# Patient Record
Sex: Female | Born: 1935 | Race: White | Hispanic: No | Smoking: Never smoker
Health system: Southern US, Community
[De-identification: ages and names within clinical notes are randomized; demographics above are authoritative.]

## PROBLEM LIST (undated history)

## (undated) DIAGNOSIS — I10 Essential (primary) hypertension: Secondary | ICD-10-CM

## (undated) DIAGNOSIS — E0590 Thyrotoxicosis, unspecified without thyrotoxic crisis or storm: Secondary | ICD-10-CM

## (undated) DIAGNOSIS — I480 Paroxysmal atrial fibrillation: Secondary | ICD-10-CM

## (undated) DIAGNOSIS — C679 Malignant neoplasm of bladder, unspecified: Secondary | ICD-10-CM

## (undated) DIAGNOSIS — I639 Cerebral infarction, unspecified: Secondary | ICD-10-CM

## (undated) DIAGNOSIS — R001 Bradycardia, unspecified: Secondary | ICD-10-CM

## (undated) HISTORY — PX: PACEMAKER INSERTION: SHX728

## (undated) HISTORY — DX: Bradycardia, unspecified: R00.1

## (undated) HISTORY — DX: Thyrotoxicosis, unspecified without thyrotoxic crisis or storm: E05.90

## (undated) HISTORY — DX: Cerebral infarction, unspecified: I63.9

## (undated) HISTORY — DX: Malignant neoplasm of bladder, unspecified: C67.9

## (undated) HISTORY — DX: Essential (primary) hypertension: I10

## (undated) HISTORY — DX: Paroxysmal atrial fibrillation: I48.0

---

## 2003-06-20 ENCOUNTER — Inpatient Hospital Stay (HOSPITAL_COMMUNITY): Admission: EM | Admit: 2003-06-20 | Discharge: 2003-06-21 | Payer: Self-pay | Admitting: Emergency Medicine

## 2004-05-03 ENCOUNTER — Ambulatory Visit: Payer: Self-pay | Admitting: Family Medicine

## 2004-05-25 ENCOUNTER — Ambulatory Visit: Payer: Self-pay | Admitting: Internal Medicine

## 2004-05-27 ENCOUNTER — Ambulatory Visit: Payer: Self-pay | Admitting: Family Medicine

## 2004-06-23 ENCOUNTER — Ambulatory Visit: Payer: Self-pay | Admitting: Family Medicine

## 2004-08-23 ENCOUNTER — Ambulatory Visit: Payer: Self-pay | Admitting: Internal Medicine

## 2004-09-28 ENCOUNTER — Ambulatory Visit: Payer: Self-pay | Admitting: Family Medicine

## 2004-11-17 ENCOUNTER — Ambulatory Visit: Payer: Self-pay | Admitting: Family Medicine

## 2004-11-22 ENCOUNTER — Ambulatory Visit: Payer: Self-pay | Admitting: Internal Medicine

## 2005-02-21 ENCOUNTER — Ambulatory Visit: Payer: Self-pay | Admitting: Internal Medicine

## 2005-03-02 ENCOUNTER — Ambulatory Visit: Payer: Self-pay | Admitting: Family Medicine

## 2005-03-21 ENCOUNTER — Ambulatory Visit: Payer: Self-pay | Admitting: Family Medicine

## 2005-03-24 ENCOUNTER — Ambulatory Visit: Payer: Self-pay | Admitting: Internal Medicine

## 2005-06-20 ENCOUNTER — Ambulatory Visit: Payer: Self-pay | Admitting: Internal Medicine

## 2005-08-04 ENCOUNTER — Ambulatory Visit: Payer: Self-pay | Admitting: Family Medicine

## 2005-08-29 ENCOUNTER — Ambulatory Visit: Payer: Self-pay | Admitting: Family Medicine

## 2005-09-19 ENCOUNTER — Ambulatory Visit: Payer: Self-pay | Admitting: Internal Medicine

## 2005-09-19 ENCOUNTER — Ambulatory Visit: Payer: Self-pay | Admitting: Family Medicine

## 2005-12-19 ENCOUNTER — Ambulatory Visit: Payer: Self-pay | Admitting: Internal Medicine

## 2006-01-23 ENCOUNTER — Ambulatory Visit: Payer: Self-pay | Admitting: Family Medicine

## 2006-02-02 ENCOUNTER — Ambulatory Visit: Payer: Self-pay | Admitting: Family Medicine

## 2006-03-10 ENCOUNTER — Ambulatory Visit: Payer: Self-pay | Admitting: Family Medicine

## 2006-03-16 ENCOUNTER — Ambulatory Visit: Payer: Self-pay | Admitting: Family Medicine

## 2006-03-20 ENCOUNTER — Ambulatory Visit: Payer: Self-pay | Admitting: Internal Medicine

## 2006-05-22 ENCOUNTER — Ambulatory Visit: Payer: Self-pay | Admitting: Family Medicine

## 2006-06-12 ENCOUNTER — Ambulatory Visit: Payer: Self-pay

## 2006-06-22 ENCOUNTER — Ambulatory Visit: Payer: Self-pay | Admitting: Family Medicine

## 2006-06-29 ENCOUNTER — Ambulatory Visit: Payer: Self-pay | Admitting: Family Medicine

## 2006-07-07 ENCOUNTER — Ambulatory Visit: Payer: Self-pay | Admitting: Family Medicine

## 2006-07-19 ENCOUNTER — Ambulatory Visit: Payer: Self-pay | Admitting: Family Medicine

## 2006-07-21 ENCOUNTER — Ambulatory Visit: Payer: Self-pay | Admitting: Internal Medicine

## 2006-08-15 ENCOUNTER — Ambulatory Visit: Payer: Self-pay | Admitting: Family Medicine

## 2006-08-21 ENCOUNTER — Ambulatory Visit: Payer: Self-pay | Admitting: Internal Medicine

## 2006-09-15 ENCOUNTER — Ambulatory Visit: Payer: Self-pay | Admitting: Internal Medicine

## 2006-09-20 ENCOUNTER — Ambulatory Visit: Payer: Self-pay | Admitting: Family Medicine

## 2006-10-13 ENCOUNTER — Ambulatory Visit: Payer: Self-pay | Admitting: Internal Medicine

## 2006-11-10 ENCOUNTER — Ambulatory Visit: Payer: Self-pay | Admitting: Internal Medicine

## 2006-12-08 ENCOUNTER — Ambulatory Visit: Payer: Self-pay | Admitting: Internal Medicine

## 2007-01-05 ENCOUNTER — Ambulatory Visit: Payer: Self-pay | Admitting: Internal Medicine

## 2007-02-02 ENCOUNTER — Ambulatory Visit: Payer: Self-pay | Admitting: Internal Medicine

## 2007-03-02 ENCOUNTER — Ambulatory Visit: Payer: Self-pay | Admitting: Internal Medicine

## 2007-03-30 ENCOUNTER — Ambulatory Visit: Payer: Self-pay | Admitting: Internal Medicine

## 2007-04-27 ENCOUNTER — Ambulatory Visit: Payer: Self-pay | Admitting: Internal Medicine

## 2007-05-09 ENCOUNTER — Ambulatory Visit: Payer: Self-pay | Admitting: Internal Medicine

## 2007-05-09 ENCOUNTER — Inpatient Hospital Stay (HOSPITAL_COMMUNITY): Admission: EM | Admit: 2007-05-09 | Discharge: 2007-05-12 | Payer: Self-pay | Admitting: Emergency Medicine

## 2007-05-11 ENCOUNTER — Ambulatory Visit: Payer: Self-pay | Admitting: Physical Medicine & Rehabilitation

## 2007-05-11 ENCOUNTER — Encounter (INDEPENDENT_AMBULATORY_CARE_PROVIDER_SITE_OTHER): Payer: Self-pay | Admitting: Neurology

## 2007-05-13 ENCOUNTER — Ambulatory Visit: Payer: Self-pay | Admitting: Cardiology

## 2007-05-13 ENCOUNTER — Emergency Department (HOSPITAL_COMMUNITY): Admission: EM | Admit: 2007-05-13 | Discharge: 2007-05-13 | Payer: Self-pay | Admitting: Emergency Medicine

## 2007-05-18 ENCOUNTER — Ambulatory Visit: Payer: Self-pay | Admitting: Cardiology

## 2007-05-25 ENCOUNTER — Ambulatory Visit: Payer: Self-pay | Admitting: Internal Medicine

## 2007-05-28 ENCOUNTER — Ambulatory Visit: Payer: Self-pay

## 2007-08-24 ENCOUNTER — Ambulatory Visit: Payer: Self-pay | Admitting: Internal Medicine

## 2007-11-23 ENCOUNTER — Ambulatory Visit: Payer: Self-pay | Admitting: Internal Medicine

## 2007-12-04 ENCOUNTER — Ambulatory Visit: Payer: Self-pay | Admitting: Internal Medicine

## 2008-02-22 ENCOUNTER — Ambulatory Visit: Payer: Self-pay | Admitting: Internal Medicine

## 2008-06-02 ENCOUNTER — Encounter (INDEPENDENT_AMBULATORY_CARE_PROVIDER_SITE_OTHER): Payer: Self-pay | Admitting: *Deleted

## 2008-06-02 ENCOUNTER — Ambulatory Visit: Payer: Self-pay

## 2008-08-16 DIAGNOSIS — I4891 Unspecified atrial fibrillation: Secondary | ICD-10-CM | POA: Insufficient documentation

## 2008-08-16 DIAGNOSIS — I498 Other specified cardiac arrhythmias: Secondary | ICD-10-CM | POA: Insufficient documentation

## 2008-08-16 DIAGNOSIS — I10 Essential (primary) hypertension: Secondary | ICD-10-CM | POA: Insufficient documentation

## 2008-08-16 DIAGNOSIS — E039 Hypothyroidism, unspecified: Secondary | ICD-10-CM | POA: Insufficient documentation

## 2008-09-01 ENCOUNTER — Ambulatory Visit: Payer: Self-pay | Admitting: Internal Medicine

## 2008-12-24 ENCOUNTER — Ambulatory Visit: Payer: Self-pay | Admitting: Internal Medicine

## 2009-04-01 ENCOUNTER — Encounter: Payer: Self-pay | Admitting: Internal Medicine

## 2009-04-13 ENCOUNTER — Telehealth: Payer: Self-pay | Admitting: Internal Medicine

## 2009-04-13 ENCOUNTER — Encounter: Payer: Self-pay | Admitting: Internal Medicine

## 2009-04-13 ENCOUNTER — Ambulatory Visit: Payer: Self-pay | Admitting: Internal Medicine

## 2009-06-17 ENCOUNTER — Ambulatory Visit: Payer: Self-pay | Admitting: Internal Medicine

## 2009-07-20 ENCOUNTER — Ambulatory Visit: Payer: Self-pay | Admitting: Internal Medicine

## 2009-08-13 ENCOUNTER — Ambulatory Visit: Payer: Self-pay | Admitting: Cardiology

## 2009-08-13 ENCOUNTER — Inpatient Hospital Stay (HOSPITAL_COMMUNITY): Admission: EM | Admit: 2009-08-13 | Discharge: 2009-08-16 | Payer: Self-pay | Admitting: Emergency Medicine

## 2009-08-14 ENCOUNTER — Encounter: Payer: Self-pay | Admitting: Cardiology

## 2009-08-15 ENCOUNTER — Encounter: Payer: Self-pay | Admitting: Cardiology

## 2009-09-02 ENCOUNTER — Encounter: Payer: Self-pay | Admitting: Cardiology

## 2009-09-08 ENCOUNTER — Encounter: Payer: Self-pay | Admitting: Internal Medicine

## 2009-09-09 ENCOUNTER — Ambulatory Visit: Payer: Self-pay | Admitting: Internal Medicine

## 2009-09-09 ENCOUNTER — Encounter: Payer: Self-pay | Admitting: Internal Medicine

## 2009-09-09 DIAGNOSIS — I5032 Chronic diastolic (congestive) heart failure: Secondary | ICD-10-CM

## 2009-09-09 LAB — CONVERTED CEMR LAB
BUN: 8 mg/dL (ref 6–23)
GFR calc non Af Amer: 63 mL/min (ref >60)
Glucose, Bld: 80 mg/dL (ref 70–99)
Potassium: 3.4 meq/L — ABNORMAL LOW (ref 3.5–5.1)

## 2009-09-25 ENCOUNTER — Encounter: Payer: Self-pay | Admitting: Cardiology

## 2009-10-08 ENCOUNTER — Emergency Department (HOSPITAL_COMMUNITY): Admission: EM | Admit: 2009-10-08 | Discharge: 2009-10-08 | Payer: Self-pay | Admitting: Emergency Medicine

## 2009-10-19 ENCOUNTER — Ambulatory Visit: Payer: Self-pay | Admitting: Internal Medicine

## 2010-01-19 ENCOUNTER — Ambulatory Visit: Payer: Self-pay | Admitting: Internal Medicine

## 2010-03-30 ENCOUNTER — Ambulatory Visit: Payer: Self-pay | Admitting: Cardiology

## 2010-03-30 ENCOUNTER — Encounter: Payer: Self-pay | Admitting: Internal Medicine

## 2010-06-17 NOTE — Assessment & Plan Note (Signed)
Summary: eph/ appt is 10:15/ o.k. per kelly 4/5/ gd   Primary Provider:  Dr. Lysbeth Galas  CC:  eph/.  History of Present Illness: Amber Love returns today for followup.  She has a h/o atrial fibrillation and symptomatic bradycardia and is s/p PPM.  She returns today for followup.  She denies c/p, peripheral edema or syncope.  She was in the hospital several weeks ago with acute on chronic diastolic CHF secondary to sodium indiscretion and she was placed on lasix and returns for followup.  She is trying to stay away from the salty foods but she admits that this has been difficult.  Her weight is up approximately 7 lbs since discharge.  Current Medications (verified): 1)  Lexapro 5 Mg Tabs (Escitalopram Oxalate) .Marland Kitchen.. 1 Tab Pm 2)  Ativan 0.5 Mg Tabs (Lorazepam) .Marland Kitchen.. 1 Tab Pm 3)  Simvastatin 20 Mg Tabs (Simvastatin) .... Take One Tablet By Mouth Daily At Bedtime 4)  Levothroid 100 Mcg Tabs (Levothyroxine Sodium) .Marland Kitchen.. 1 Tab Daily 5)  Amlodipine Besylate 2.5 Mg Tabs (Amlodipine Besylate) .... Take One Tablet By Mouth Daily 6)  Warfarin Sodium 5 Mg Tabs (Warfarin Sodium) .... Use As Directed By Anticoagulation Clinic 7)  Metoprolol Tartrate 50 Mg Tabs (Metoprolol Tartrate) .... Take One Tablet By Mouth Twice A Day 8)  Aricept 5 Mg Tabs (Donepezil Hcl) .... Take One Tablet Once Daily  Allergies (verified): No Known Drug Allergies  Past History:  Past Medical History: Last updated: 08/16/2008 BLADDER CANCER, HX OF (ICD-188.9) CVA, HX OF (ICD-434.91) HYPOTHYROIDISM (ICD-244.9) BRADYCARDIA (ICD-427.89) PAROXYSMAL ATRIAL FIBRILLATION (ICD-427.31) HYPERTENSION (ICD-401.9)  Past Surgical History: Last updated: 08/16/2008 PACEMAKER, PERMANENT/MEDTRONIC SIGMA 303B (ICD-V45.01)    Review of Systems       The patient complains of dyspnea on exertion.  The patient denies chest pain, syncope, and peripheral edema.    Vital Signs:  Patient profile:   77 year old female Height:      62  inches Weight:      125 pounds Pulse rate:   63 / minute BP sitting:   132 / 80  (left arm) Cuff size:   regular  Vitals Entered By: Judithe Modest CMA (September 09, 2009 10:39 AM)  Physical Exam  General:  Well developed, well nourished, in no acute distress. Head:  normocephalic and atraumatic Eyes:  PERRLA/EOM intact; conjunctiva and lids normal. Mouth:  Teeth, gums and palate normal. Oral mucosa normal. Neck:  Neck supple, no JVD. No masses, thyromegaly or abnormal cervical nodes. Chest Wall:  Well healed PPM incision. Lungs:  Clear bilaterally to auscultation except for basilar rales. Heart:  RRR with normal S1 and S2.  PMI is not enlarged or laterally displaced. Abdomen:  Bowel sounds positive; abdomen soft and non-tender without masses, organomegaly, or hernias noted. No hepatosplenomegaly. Neurologic:  Alert and oriented x 3.   EKG  Procedure date:  09/09/2009  Findings:      Atrial fibrillation with a controlled ventricular response rate of: 63. Ventricle is paced.    PPM Specifications Following MD:  Lewayne Bunting, MD     PPM Vendor:  Medtronic     PPM Model Number:  303B     PPM Serial Number:  [id number] PPM DOI:  06/20/2003      Lead 1    Location: RA     DOI: 06/20/2003     Model #: 5284     Serial #: [id number]     Status: active Lead 2    Location: RV  DOI: 06/20/2003     Model #: [id number]     Serial #: [id number]     Status: active  Magnet Response Rate:  BOL 85 ERI 65  Indications:  Tachy-brady syndrome   PPM Follow Up Remote Check?  No Battery Voltage:  2.80 V     Battery Est. Longevity:  7 YEARS     Pacer Dependent:  No       PPM Device Measurements Atrium  Amplitude: 2 mV, Impedance: 583 ohms,  Right Ventricle  Amplitude: 11.2 mV, Impedance: 738 ohms, Threshold: 1.0 V at 0.4 msec  Episodes Percent Mode Switch:  100%     Coumadin:  Yes Ventricular High Rate:  143     Ventricular Pacing:  65%  Parameters Mode:  DDDR     Lower Rate Limit:  60      Upper Rate Limit:  120 Paced AV Delay:  250     Sensed AV Delay:  200 Next Cardiology Appt Due:  02/13/2010 Tech Comments:  Normal device function.  No changes made today.  ROV 6 months clinic.  VHR episodes are short runs of afib with RVR. Gypsy Balsam RN BSN  September 09, 2009 10:57 AM  MD Comments:  Agree with above.  Impression & Recommendations:  Problem # 1:  PACEMAKER, PERMANENT/MEDTRONIC SIGMA 303B (ICD-V45.01) Her device is working normally.  Will recheck in several months.  Problem # 2:  CHRONIC DIASTOLIC HEART FAILURE (ICD-428.32) Her weight is up and I have asked that she increase her lasix to two tabs for 3 days then go back to one tab a day. A low sodium diet is recommended. Her updated medication list for this problem includes:    Amlodipine Besylate 2.5 Mg Tabs (Amlodipine besylate) .Marland Kitchen... Take one tablet by mouth daily    Warfarin Sodium 5 Mg Tabs (Warfarin sodium) ..... Use as directed by anticoagulation clinic    Metoprolol Tartrate 50 Mg Tabs (Metoprolol tartrate) .Marland Kitchen... Take one tablet by mouth twice a day    Furosemide 40 Mg Tabs (Furosemide) .Marland Kitchen... Take one tablet by mouth daily.  Problem # 3:  PAROXYSMAL ATRIAL FIBRILLATION (ICD-427.31) Her ventricular rate is well controlled and she is predominantly paced.  Continue current meds. Her updated medication list for this problem includes:    Warfarin Sodium 5 Mg Tabs (Warfarin sodium) ..... Use as directed by anticoagulation clinic    Metoprolol Tartrate 50 Mg Tabs (Metoprolol tartrate) .Marland Kitchen... Take one tablet by mouth twice a day   Patient Instructions: 1)  Your physician has recommended you make the following change in your medication: Increase Furosemide to 40mg  1 tablet twice daily for 3 days then resume 1 tablet daily. 2)  Your physician wants you to follow-up in: 12 months with Dr Ladona Ridgel.  You will receive a reminder letter in the mail two months in advance. If you don't receive a letter, please call our office to  schedule the follow-up appointment.

## 2010-06-17 NOTE — Cardiovascular Report (Signed)
Summary: TTM   TTM   Imported By: Roderic Ovens 11/12/2009 10:06:18  _____________________________________________________________________  External Attachment:    Type:   Image     Comment:   External Document

## 2010-06-17 NOTE — Cardiovascular Report (Signed)
Summary: Office Visit   Office Visit   Imported By: Roderic Ovens 09/15/2009 16:47:17  _____________________________________________________________________  External Attachment:    Type:   Image     Comment:   External Document

## 2010-06-17 NOTE — Cardiovascular Report (Signed)
Summary: TTM   TTM   Imported By: Roderic Ovens 08/13/2009 13:21:01  _____________________________________________________________________  External Attachment:    Type:   Image     Comment:   External Document

## 2010-06-17 NOTE — Miscellaneous (Signed)
Summary: ECHO  Clinical Lists Changes  Observations: Added new observation of ECHOINTERP:  Study Conclusions    - Left ventricle: Septal and apical hypokinesis The cavity size was     normal. Wall thickness was increased in a pattern of mild LVH.     Systolic function was mildly reduced. The estimated ejection     fraction was in the range of 45% to 50%.   - Mitral valve: Thickened anterior leaflet with restricted motion ?     prolapse. MR jet eccentric in posterior direction Mild to moderate     regurgitation.   - Left atrium: The atrium was mildly dilated.   - Right atrium: The atrium was mildly dilated.   - Atrial septum: No defect or patent foramen ovale was identified.  --------------------------------------------------------------------   Prepared and Electronically Authenticated by    Charlton Haws, MD, [facility]   2011-04-01T13:56:[id number] (08/14/2009 11:18)      Echocardiogram  Procedure date:  08/14/2009  Findings:       Study Conclusions    - Left ventricle: Septal and apical hypokinesis The cavity size was     normal. Wall thickness was increased in a pattern of mild LVH.     Systolic function was mildly reduced. The estimated ejection     fraction was in the range of 45% to 50%.   - Mitral valve: Thickened anterior leaflet with restricted motion ?     prolapse. MR jet eccentric in posterior direction Mild to moderate     regurgitation.   - Left atrium: The atrium was mildly dilated.   - Right atrium: The atrium was mildly dilated.   - Atrial septum: No defect or patent foramen ovale was identified.  --------------------------------------------------------------------   Prepared and Electronically Authenticated by    Charlton Haws, MD, [facility]   2011-04-01T13:56:[id number]

## 2010-06-17 NOTE — Miscellaneous (Signed)
Summary: Home Health Certification/Care Plan  Home Health Certification/Care Plan   Imported By: Roderic Ovens 09/10/2009 15:31:29  _____________________________________________________________________  External Attachment:    Type:   Image     Comment:   External Document

## 2010-06-17 NOTE — Procedures (Signed)
Summary: pacer check/medtronic   Current Medications (verified): 1)  Lexapro 5 Mg Tabs (Escitalopram Oxalate) .Marland Kitchen.. 1 Tab Pm 2)  Ativan 0.5 Mg Tabs (Lorazepam) .Marland Kitchen.. 1 Tab Pm 3)  Simvastatin 20 Mg Tabs (Simvastatin) .... Take One Tablet By Mouth Daily At Bedtime 4)  Levothroid 100 Mcg Tabs (Levothyroxine Sodium) .Marland Kitchen.. 1 Tab Daily 5)  Amlodipine Besylate 2.5 Mg Tabs (Amlodipine Besylate) .... Take One Tablet By Mouth Daily 6)  Warfarin Sodium 5 Mg Tabs (Warfarin Sodium) .... Use As Directed By Anticoagulation Clinic 7)  Metoprolol Tartrate 50 Mg Tabs (Metoprolol Tartrate) .... Take 1/2  Tablet By Mouth Twice A Day 8)  Furosemide 40 Mg Tabs (Furosemide) .... Take One Tablet By Mouth Daily.  Allergies (verified): No Known Drug Allergies  PPM Specifications Following MD:  Lewayne Bunting, MD     PPM Vendor:  Medtronic     PPM Model Number:  303B     PPM Serial Number:  [id number] PPM DOI:  06/20/2003      Lead 1    Location: RA     DOI: 06/20/2003     Model #: 6644     Serial #: [id number]     Status: active Lead 2    Location: RV     DOI: 06/20/2003     Model #: 6387     Serial #: [id number]     Status: active  Magnet Response Rate:  BOL 85 ERI 65  Indications:  Tachy-brady syndrome   PPM Follow Up Remote Check?  No Battery Voltage:  2.80 V     Battery Est. Longevity:  6.5 years     Pacer Dependent:  No       PPM Device Measurements Atrium  Amplitude: 1.4 mV, Impedance: 546 ohms,  Right Ventricle  Amplitude: 11.20 mV, Impedance: 734 ohms, Threshold: 1.0 V at 0.4 msec  Episodes MS Episodes:  712     Percent Mode Switch:  64.8*     Coumadin:  Yes Ventricular High Rate:  70     Atrial Pacing:  12.7%     Ventricular Pacing:  18%  Parameters Mode:  DDDR     Lower Rate Limit:  60     Upper Rate Limit:  120 Paced AV Delay:  250     Sensed AV Delay:  200 Next Cardiology Appt Due:  09/14/2010 Tech Comments:  No parameter changes.  64% A-fib, + coumadin, ventricular rates controlled.  70VHR  episodes the longest all 1-2 seconds.  TTM's every 3 months with [employer].  ROV 6 months with Dr. Ladona Ridgel in [place]. Altha Harm, LPN  March 30, 2010 12:39 PM

## 2010-06-17 NOTE — Cardiovascular Report (Signed)
Summary: Office Visit   Office Visit   Imported By: Roderic Ovens 04/26/2010 14:17:56  _____________________________________________________________________  External Attachment:    Type:   Image     Comment:   External Document

## 2010-06-17 NOTE — Cardiovascular Report (Signed)
Summary: Office Visit   [facility]   Imported By: Roderic Ovens 07/13/2009 12:44:33  _____________________________________________________________________  External Attachment:    Type:   Image     Comment:   External Document

## 2010-06-17 NOTE — Cardiovascular Report (Signed)
Summary: TTM   TTM   Imported By: Roderic Ovens 02/01/2010 11:06:29  _____________________________________________________________________  External Attachment:    Type:   Image     Comment:   External Document

## 2010-06-17 NOTE — Procedures (Signed)
Summary: device/saf   Current Medications (verified): 1)  Lexapro 5 Mg Tabs (Escitalopram Oxalate) .Marland Kitchen.. 1 Tab Pm 2)  Ativan 0.5 Mg Tabs (Lorazepam) .Marland Kitchen.. 1 Tab Pm 3)  Simvastatin 20 Mg Tabs (Simvastatin) .... Take One Tablet By Mouth Daily At Bedtime 4)  Levothroid 100 Mcg Tabs (Levothyroxine Sodium) .Marland Kitchen.. 1 Tab Daily 5)  Amlodipine Besylate 2.5 Mg Tabs (Amlodipine Besylate) .... Take One Tablet By Mouth Daily 6)  Warfarin Sodium 5 Mg Tabs (Warfarin Sodium) .... Use As Directed By Anticoagulation Clinic 7)  Metoprolol Tartrate 50 Mg Tabs (Metoprolol Tartrate) .... Take One Tablet By Mouth Twice A Day  Allergies (verified): No Known Drug Allergies  PPM Specifications Following MD:  Lewayne Bunting, MD     PPM Vendor:  Medtronic     PPM Model Number:  303B     PPM Serial Number:  [id number] PPM DOI:  06/20/2003      Lead 1    Location: RA     DOI: 06/20/2003     Model #: 4098     Serial #: [id number]     Status: active Lead 2    Location: RV     DOI: 06/20/2003     Model #: 5621     Serial #: [id number]     Status: active  Magnet Response Rate:  BOL 85 ERI 65  Indications:  Tachy-brady syndrome   PPM Follow Up Remote Check?  No Battery Voltage:  2.8 V     Battery Est. Longevity:  7.5 years     Pacer Dependent:  No       PPM Device Measurements Atrium  Amplitude: 1.4 mV, Impedance: 493 ohms,  Right Ventricle  Amplitude: 11.20 mV, Impedance: 713 ohms, Threshold: 1.0 V at 0.4 msec  Episodes MS Episodes:  4     Percent Mode Switch:  98.9%     Coumadin:  Yes Ventricular High Rate:  4     Atrial Pacing:  29.2%     Ventricular Pacing:  40%  Parameters Mode:  DDDR     Lower Rate Limit:  60     Upper Rate Limit:  120 Paced AV Delay:  250     Sensed AV Delay:  200 Next Cardiology Appt Due:  12/14/2009 Tech Comments:  No parameter changes.  6024 AHR episodes, A-fib with 1.2% total heart rates > 120bpm.  Her histogram is somewhat blunted but given that she is 77 years old and had a fall  recently leaving her right ankle edematous, it is adequate.  It is also noted that there were no fractures with this fall.  She will continue with her TTM's through [employer] every 3 months and return in 6 to see Dr. Ladona Ridgel. Altha Harm, LPN  June 17, 2009 2:48 PM  MD Comments:  Agree with above.

## 2010-06-17 NOTE — Miscellaneous (Signed)
Summary: [facility] Face to Face Encounter Sheet  [facility] Face to Face Encounter Sheet   Imported By: Roderic Ovens 10/13/2009 11:14:18  _____________________________________________________________________  External Attachment:    Type:   Image     Comment:   External Document

## 2010-07-21 ENCOUNTER — Encounter: Payer: Self-pay | Admitting: Internal Medicine

## 2010-07-21 DIAGNOSIS — I495 Sick sinus syndrome: Secondary | ICD-10-CM

## 2010-08-02 LAB — DIFFERENTIAL
Basophils Relative: 1 % (ref 0–1)
Eosinophils Absolute: 0.1 10*3/uL (ref 0.0–0.7)
Monocytes Absolute: 0.7 10*3/uL (ref 0.1–1.0)
Monocytes Relative: 11 % (ref 3–12)

## 2010-08-02 LAB — BASIC METABOLIC PANEL
BUN: 11 mg/dL (ref 6–23)
CO2: 30 mEq/L (ref 19–32)
Chloride: 103 mEq/L (ref 96–112)
Creatinine, Ser: 0.84 mg/dL (ref 0.4–1.2)
Glucose, Bld: 103 mg/dL — ABNORMAL HIGH (ref 70–99)

## 2010-08-02 LAB — BRAIN NATRIURETIC PEPTIDE: Pro B Natriuretic peptide (BNP): 351 pg/mL — ABNORMAL HIGH (ref 0.0–100.0)

## 2010-08-02 LAB — CBC
MCHC: 33.8 g/dL (ref 30.0–36.0)
MCV: 91.1 fL (ref 78.0–100.0)
Platelets: 177 10*3/uL (ref 150–400)
RDW: 13.8 % (ref 11.5–15.5)

## 2010-08-02 LAB — CK TOTAL AND CKMB (NOT AT ARMC): Relative Index: INVALID (ref 0.0–2.5)

## 2010-08-04 LAB — CARDIAC PANEL(CRET KIN+CKTOT+MB+TROPI)
CK, MB: 1.5 ng/mL (ref 0.3–4.0)
CK, MB: 4.5 ng/mL — ABNORMAL HIGH (ref 0.3–4.0)
Relative Index: 4.1 — ABNORMAL HIGH (ref 0.0–2.5)
Relative Index: INVALID (ref 0.0–2.5)
Relative Index: INVALID (ref 0.0–2.5)
Total CK: 76 U/L (ref 7–177)
Troponin I: 0.02 ng/mL (ref 0.00–0.06)
Troponin I: 0.03 ng/mL (ref 0.00–0.06)
Troponin I: 0.03 ng/mL (ref 0.00–0.06)

## 2010-08-04 LAB — PROTIME-INR
INR: 2.1 — ABNORMAL HIGH (ref 0.00–1.49)
INR: 2.12 — ABNORMAL HIGH (ref 0.00–1.49)
Prothrombin Time: 21.2 seconds — ABNORMAL HIGH (ref 11.6–15.2)
Prothrombin Time: 23.4 seconds — ABNORMAL HIGH (ref 11.6–15.2)

## 2010-08-04 LAB — BASIC METABOLIC PANEL
CO2: 32 mEq/L (ref 19–32)
CO2: 37 mEq/L — ABNORMAL HIGH (ref 19–32)
Calcium: 8.7 mg/dL (ref 8.4–10.5)
Chloride: 96 mEq/L (ref 96–112)
Chloride: 98 mEq/L (ref 96–112)
Creatinine, Ser: 0.72 mg/dL (ref 0.4–1.2)
Creatinine, Ser: 0.95 mg/dL (ref 0.4–1.2)
GFR calc Af Amer: >60 mL/min (ref >60)
GFR calc Af Amer: >60 mL/min (ref >60)
GFR calc non Af Amer: 45 mL/min — ABNORMAL LOW (ref >60)
GFR calc non Af Amer: >60 mL/min (ref >60)
Glucose, Bld: 159 mg/dL — ABNORMAL HIGH (ref 70–99)
Potassium: 4 mEq/L (ref 3.5–5.1)
Potassium: 4.9 mEq/L (ref 3.5–5.1)
Sodium: 139 mEq/L (ref 135–145)
Sodium: 140 mEq/L (ref 135–145)

## 2010-08-08 ENCOUNTER — Other Ambulatory Visit: Payer: Self-pay | Admitting: Cardiology

## 2010-08-08 LAB — CBC
HCT: 34.5 % — ABNORMAL LOW (ref 36.0–46.0)
Hemoglobin: 11.4 g/dL — ABNORMAL LOW (ref 12.0–15.0)
MCHC: 33.1 g/dL (ref 30.0–36.0)
MCV: 93.1 fL (ref 78.0–100.0)
Platelets: 203 10*3/uL (ref 150–400)
RDW: 13.3 % (ref 11.5–15.5)

## 2010-08-08 LAB — POCT I-STAT, CHEM 8
BUN: 9 mg/dL (ref 6–23)
Creatinine, Ser: 0.7 mg/dL (ref 0.4–1.2)
Glucose, Bld: 185 mg/dL — ABNORMAL HIGH (ref 70–99)
Sodium: 141 mEq/L (ref 135–145)
TCO2: 30 mmol/L (ref 0–100)

## 2010-08-08 LAB — DIFFERENTIAL
Basophils Absolute: 0 10*3/uL (ref 0.0–0.1)
Eosinophils Absolute: 0 10*3/uL (ref 0.0–0.7)
Eosinophils Relative: 0 % (ref 0–5)
Lymphocytes Relative: 4 % — ABNORMAL LOW (ref 12–46)
Monocytes Absolute: 0.3 10*3/uL (ref 0.1–1.0)

## 2010-08-08 LAB — URINE CULTURE: Colony Count: NO GROWTH

## 2010-08-08 LAB — POCT CARDIAC MARKERS
CKMB, poc: 1.5 ng/mL (ref 1.0–8.0)
Myoglobin, poc: 69.4 ng/mL (ref 12–200)

## 2010-08-12 NOTE — Telephone Encounter (Signed)
[address] °

## 2010-09-09 ENCOUNTER — Other Ambulatory Visit: Payer: Self-pay | Admitting: Cardiology

## 2010-09-28 NOTE — H&P (Signed)
NAME:  Amber Love, Amber Love              ACCOUNT NO.:  [id number]   MEDICAL RECORD NO.:  [id number]          PATIENT TYPE:  INP   LOCATION:  1844                         FACILITY:  [facility]   PHYSICIAN:  Michael L. Reynolds, M.D.DATE OF BIRTH:  01/25/1921   DATE OF ADMISSION:  05/09/2007  DATE OF DISCHARGE:                              HISTORY & PHYSICAL   REASON FOR EVALUATION:  Code stroke with left-sided weakness.   HISTORY OF PRESENT ILLNESS:  This is the initial [facility] stroke  service admission for this 77 year old woman with a past medical history  which includes a pacemaker placement for tachybrady syndrome and a  remote history of atrial fibrillation.  The patient was at home this  evening with her family and by our reports at about 4:00 p.m. she was  normal, walking around preparing food and delivering a plate of food to  her daughter.  At about 4:10 p.m. she sat down, complaining that she  felt as if she was going to pass out. She also complained that she was  not seeing well, and she was having some double vision.  Her family  looked at her closely and noted that her left eye was turning in. She  also had some left-sided facial drooping and slurred speech.  EMS was  alerted and picked her up and brought her to [facility].  A  code stroke was called en route, but was later cancelled because the  patient's symptoms got much better.  However, after she arrived at the  emergency room she was evaluated by Dr. Patrica Duel who noted that she had  ongoing left facial droop, slurred speech, left hemiparesis, and  abnormal eye movements, and reconsulted for urgent neurologic opinion  regarding her situation.  The patient says that she has never had  anything like this before.  She denies any associated headache.  She has  had some palpitations today but nothing she regards as out of the  ordinary for her usual situation.  She denies associated shortness  breath, chest pain, nausea,  vomiting, alteration of consciousness.   PAST MEDICAL HISTORY:  She was diagnosed with tachybrady syndrome back  in 2005, at which time she had some syncope.  She had a pacemaker placed  at that time.  Just prior to that she did have some atrial fibrillation,  and has briefly been on Coumadin in the past but discontinued that a  couple of years ago due to epistaxis and has not been on it since.  It  is not clear that this has recurred.  Other medical problems include  hypothyroidism and hypertension.  She also has remote history of bladder  cancer.   MEDICATIONS:  1. Aspirin 81 mg daily.  2. Plavix 75 mg daily.  3. Levothyroxine 100 micrograms daily.  4. Metoprolol 50 mg b.i.d.  5. Ativan 0.5 mg q.h.s. p.r.n.   ALLERGIES:  NO KNOWN DRUG ALLERGIES.   FAMILY HISTORY:  History remarkable for stroke in mother.  Father died  of kidney failure.  She has a daughter with COPD and diabetes.   SOCIAL HISTORY:  She is a retired Education officer, environmental is normally independent in  her activities of daily living.  She actually cares for her daughter,  who is severely afflicted with diabetes and COPD.  There is no history  of tobacco, alcohol or illicit drug use.   REVIEW OF SYSTEMS:  NEUROLOGIC:  As above.  CV:  Occasional  palpitations.  No chest pain.  MUSCULOSKELETAL:  Painful feet.   Pulmonary, GI, GU, endocrine, heme, allergy, psychiatric all negative.   PHYSICAL EXAMINATION:  VITAL SIGNS:  Temperature 97.9, blood pressure of  164/70, pulse 68, respirations 18, O2 saturation 100% on two liters of  O2 nasal cannula.  Weight 130 pounds (this is estimated).  GENERAL:  This is a healthy-appearing woman supine on the hospital bed  in no distress.  HEENT:  Head normocephalic and atraumatic.  Oropharynx benign.  NECK:  Supple without carotid or supraclavicular bruits.  HEART:  Regular rate and rhythm without murmur.  CHEST:  Clear to auscultation bilaterally.  ABDOMEN:  Soft, normoactive bowel  sounds.    EXTREMITIES:  1+ edema.  Decreased pedal pulses.  NEUROLOGIC:  Mental status; she is awake and alert.  She is not able to  tell me her age, but otherwise is oriented to place and time.  She is  able to name some objects although her visual problems limit testing.  Her speech is moderately dysarthric but appropriate in content.  Cranial  Nerves: Pupils are 3 mm, briskly reactive to 2.  She has a left  esotropia and downward deviation of the left eye.  A left gaze paralysis  is noted, and she seems to have incomplete abduction of the right eye as  well.  Left facial droop is noted.  Tongue and palate move normally and  symmetrically.  Testing of visual fields is clearly limited on the left,  likely worse in the upper quadrant than in the lower quadrant.  MOTOR:  Normal bulk and tone.  She has decreased strength in the left  upper extremity, and drifts down to that of both the upper and lower  extremities.  Strength is approximately 4- to 4/5  in a pyramidal  distribution.  SENSORY:  She reports diminished pinprick sensation of the left arm  compared to the right, otherwise intact to light touch and pin prick  throughout.  COORDINATION:  Finger-to-nose is performed with difficulty due to her  visual difficulties but there is no gross ataxia.  Reflexes are 2+  symmetric.  Gait is deferred.  Toe is down on the left and up on the  right. NIHSS score is 12.   LABORATORY DATA:  CBC; white count 6.8, hemoglobin 10.4, platelets  206,000, sodium 141, potassium 3.6, chloride 108, BUN and creatinine 14  and 1.0 respectively, glucose 110.  Coag's are normal.  CT of the head  is personally reviewed and the study is remarkable primarily for atrophy  with minimal white matter disease.   IMPRESSION:  Acute posterior circulation stroke with abnormal  extraocular movements, visual field disturbance, hemiparesis, facial  droop and dysarthria.  She is a candidate for intravenous t-PA due to   time.   PLAN:  She has received intravenous t-PA with treatment starting at  18:50.  After this treatment she will be admitted to ICU for routine  post t-PA care.  She will need a repeat CT of the head tomorrow along  with CT  angiogram of the head and neck.  She cannot have an MRI due to  her  pacer.  She will also need a routine stroke workup including carotid  transcranial Doppler's, 2-D echocardiogram, stroke labs.  Physical,  occupational and speech therapy will be consulted.  Stroke service to  follow.      Michael L. Thad Ranger, M.D.  Electronically Signed     MLR/MEDQ  D:  05/09/2007  T:  05/10/2007  Job:  [id number]   cc:   Doylene Canning. Ladona Ridgel, MD  Delaney Meigs, M.D.

## 2010-09-28 NOTE — Assessment & Plan Note (Signed)
[facility]                         ELECTROPHYSIOLOGY OFFICE NOTE   NAME:WILLIAMSKamla, Amber Love                     MRN:          [id number]  DATE:12/04/2007                            DOB:          December 15, 1920    Amber Love returns today for followup.  She is a very pleasant, almost  77 year old woman with a history of symptomatic bradycardia and  paroxysmal AFib who also has some hypertension and has been on Coumadin  therapy now for several years.  She has had problems with nosebleeds in  the past, but these have been largely rectified by careful following of  her Coumadin levels and discontinuation of aspirin and Plavix.  She  returns today for evaluation.  She denies chest pain or shortness of  breath.  She has otherwise been stable.  She notes that she had had some  problems with weight loss but has gained her weight back.   MEDICATIONS:  1. Synthroid 100 mcg daily.  2. Lopressor 50 twice daily.  3. Lorazepam daily.  4. Coumadin 5 mg as directed.  5. Simvastatin 20 a day.   PHYSICAL EXAMINATION:  GENERAL;  She is a pleasant, well-appearing  elderly woman, in no distress.  VITAL SIGNS:  Blood pressure 144/69, pulse 70 and regular, respirations  were 18, and weight was 136 pounds.  NECK:  No jugular venous distention.  LUNGS:  Clear bilaterally to auscultation.  No wheezes, rales, or  rhonchi are present.  CARDIOVASCULAR:  Regular rate and rhythm.  Normal S1 and S2.  EXTREMITIES:  Demonstrated no edema.  ABDOMEN:  Soft and nontender.   Interrogation of her pacemaker demonstrates Medtronic Sigma, the P-waves  greater than 2, the R-waves greater than 11, the impedance 558 in the A  and 722 in the V, and threshold 0.5 at 0.4 in the A.  Battery voltage  was 2.8 volts.  She was 96% A-paced and 45% V-paced.   IMPRESSION:  1. Symptomatic bradycardia.  2. Status post pacemaker insertion.  3. Hypertension.  4. Paroxysmal atrial fibrillation.   DISCUSSION:  Overall, Amber Love is stable.  Her pacemaker is working  normally.  Her AFib is well controlled.  She will continue on her  Coumadin.  I will plan to see her back in the office in 1 year or sooner  should she have additional problems.     Doylene Canning. Ladona Ridgel, MD  Electronically Signed    GWT/MedQ  DD: 12/04/2007  DT: 12/05/2007  Job #: [id number]

## 2010-09-28 NOTE — Consult Note (Signed)
NAMEMarland Love  SYBRINA, LANING NO.:  [id number]   MEDICAL RECORD NO.:  [id number]          PATIENT TYPE:  EMS   LOCATION:  MAJO                         FACILITY:  [facility]   PHYSICIAN:  Gerrit Friends. Dietrich Pates, MD, FACCDATE OF BIRTH:  August 23, 1920   DATE OF CONSULTATION:  05/13/2007  DATE OF DISCHARGE:  05/13/2007                                 CONSULTATION   PRIMARY CARE PHYSICIAN:  Dr. Lysbeth Galas.   PRIMARY CARDIOLOGIST:  Dr. Ladona Ridgel.   HISTORY OF PRESENT ILLNESS:  We appreciate the opportunity to perform  this outpatient consultation on Amber Love in the emergency department  at [facility].  Amber Love presented with palpitations.  Although, she has sick sinus syndrome and has had a pacemaker for the  past 3 years, she does not recall as a similar sensation.  She noted  some extra beats and pauses, mostly when she took her own pulses.  She  also felt the pacemaker and thought that it had changed positions under  her skin.  Her family summoned EMS who transported her to the emergency  department.   The patient's prior medical records were obtained.  These included an  admission just a few days ago for a CVA/RIND.  The Neurology Service  obtained a history that she could not take warfarin, and apparently did  not change her chronic medication Which includes aspirin 81 mg daily,  clopidogrel 75 mg daily, levothyroxine 0.1 mg daily, metoprolol 50 mg  b.i.d., Ativan 0.5 mg q.h.s. p.r.n.  She has no known drug allergies.  She presented with confusion, double vision, a left central seventh  cranial nerve defect and slurred speech.  All of her symptoms cleared  over the course of a day or two.   The patient's history of Coumadin intolerance involves an episode of  epistaxis a few weeks after she started Coumadin in 2005.  She required  nasal packing for a few days.  She was told by the emergency room doctor  that she could never take Coumadin again.  She required  implantation of  a pacemaker before being started on Coumadin when she was noted to have  pauses despite the absence of an AV nodal blocking agent.  She has been  followed in the pacemaker clinic in [place] since then, although she  lives in [place] and her primary physician is in [place].   Prior medical problems include a longstanding goiter and hypothyroidism  as well as hypertension.  She has a remote history of carcinoma of the  bladder.  There is also history of Raynaud's phenomenon.   SOCIAL HISTORY:  Retired Education officer, environmental; independent in all ADLs.  She has  cared for her daughter who has multiple medical problems.  No tobacco  nor alcohol use.   FAMILY HISTORY:  Mother died at an advanced age of unknown causes and  father died at age 109 due to renal disease.  Daughter has COPD and  diabetes.   REVIEW OF SYSTEMS:  Notable for intermittent arthritic discomfort in the  feet.  She has occasional headaches.  All other systems reviewed and  are  negative.   PHYSICAL EXAMINATION:  GENERAL:  Pleasant trim older woman in no acute  distress.  VITAL SIGNS:  The temperature is 97 and blood pressure 155/70, heart  rate 70 and regular, respirations 18.  HEENT:  EOMs full; normal lids and conjunctiva; normal oral mucosa.  NECK:  Prominent goiter extending anteriorly; normal carotid upstrokes  without bruits; no jugular venous distention.  HEMATOPOIETIC:  No adenopathy.  SKIN:  No significant lesions.  PSYCHIATRIC:  Alert and oriented; normal affect.  LUNGS:  Mild kyphosis; clear lung fields.  CARDIAC:  Normal first and second heart sounds; modest systolic ejection  murmur.  ABDOMEN:  Soft and nontender; no masses; no organomegaly; normal bowel  sounds without bruits.  EXTREMITIES:  Trace edema; 1-2+ distal pulses.   LABORATORY DATA:  Recent laboratory includes a hemoglobin of 11 and a  normal metabolic profile.   IMPRESSION:  Amber Love' symptoms appear to be insignificant.  We  will  arrange for outpatient interrogation of her pacemaker.  I am concerned  about a recent and neurologic event with known chronic atrial  arrhythmias.  I have recommended that Amber Love stop Plavix and start  Coumadin at an initial dose of 5 mg 3 days per week and 2.5 mg 4 days  per week.  She reluctantly agrees to do this.  She will call the [facility]  office to arrange enrollment in anticoagulation clinic.  We will have  her evaluated by an ENT physician if she develops recurrent epistaxis.  She will require assessment of anemia by her primary care physician.      Gerrit Friends. Dietrich Pates, MD, [facility]  Electronically Signed     RMR/MEDQ  D:  05/13/2007  T:  05/14/2007  Job:  [id number]

## 2010-09-28 NOTE — Discharge Summary (Signed)
NAMEMarland Kitchen  Love, Amber              ACCOUNT NO.:  [id number]   MEDICAL RECORD NO.:  [id number]          PATIENT TYPE:  INP   LOCATION:  3015                         FACILITY:  [facility]   PHYSICIAN:  Deanna Artis. Hickling, M.D.DATE OF BIRTH:  04-19-1921   DATE OF ADMISSION:  05/09/2007  DATE OF DISCHARGE:  05/12/2007                               DISCHARGE SUMMARY   FINAL DIAGNOSES:  1. 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  2. Chronic atrial fibrillation.  3. Status post pacemaker for sick sinus syndrome.  4. Hypertension.  5. Hypothyroidism.   PROCEDURES:  1. CT scan of the brain x2.  2. CT angiogram.  3. Intravenous t-PA infusion.   COMPLICATIONS:  None.   SUMMARY OF THE HOSPITALIZATION:  The patient was admitted as a code  stroke with complaints of double vision, slurred speech, left facial  droop and left hemiparesis.  She was evaluated by my partner, Dr.  Kelli Hope, who noted that she had problems with left esotropia,  downward deviation of the left eye, left gaze paralysis, incomplete  abduction of the right eye, left facial droop and 4/5 strength on the  left side.  Dr. Thad Ranger concluded that the patient had had an acute  posterior circulation stroke, probably involving the pons.  Unfortunately, because of her pacemaker an MRI scan could not be done.  This could not be verified.   The patient responded brilliantly to the t-PA and her NIH stroke scale  which had been 13 initially dropped to zero.   The patient has been evaluated in the hospital.  A 2-D echocardiogram  shows ejection fraction of 60-70%, slight increase left ventricular  wall, normal left ventricular function, no source of emboli.  CT  angiogram showed no evidence of occlusive disease in the vertebral or  carotid circulation.  Serum homocysteine 10.2, hemoglobin A1c 5.9, total  cholesterol 144, LDL 94, HDL 42, triglycerides 42.  The patient had  benzodiazepines in her urine drug screen  which reflects the use of  Ativan at nighttime on occasion.   Her hemoglobin is stable at 11.1, hematocrit 32.4, platelet count normal  at 171,000.  Electrolytes were normal.  Blood gases were normal.   PHYSICAL EXAMINATION:  VITAL SIGNS:  Blood pressure 146/67, resting  pulse 61, respirations 18, temperature of 98.6.  GENERAL:  She is on a heart-healthy diet.  She is out of bed and active.  Her oxygenation is 95% on room air.  The patient feels well.  Her  examination was entirely normal.  NEUROLOGICAL:  Visual fields are full.  She has symmetric facial  strength.  Full extraocular movements.  Midline tongue.  Normal  strength, sensation, normal gait and diminished reflexes.   She is ready to be discharged home.   DISCHARGE MEDICATIONS:  Include Plavix 75 mg daily, Synthroid 100 mcg  daily, metoprolol 50 mg twice daily.  I have discontinued aspirin  because the combination of aspirin and Plavix does afford additional  benefit over either aspirin alone or Plavix alone and Plavix clearly was  superior to aspirin in the [place] study.  While this is  controversial I  believe that recent Retavase control studies back it up.   In addition, the patient has very significant bruising in her  extremities which is related to the combination of aspirin and Plavix.  She has been seen by rehab who felt that she did not require  comprehensive inpatient rehabilitation.  On the basis of examination  today I do not believe that she needs home health.  We discussed the use  of Coumadin for this patient.  I believe that it presents an  unreasonable risk to her particularly given that she had problems with  epistaxis in the past.   While she is at risk for having stroke because of atrial fibrillation  antiplatelet drug seemed to be the only appropriate treatment.   The patient does not have any other risk factors that need to be  addressed.  Her blood pressure today is slightly elevated but near the   target of 120/70.  The patient consumes a heart-healthy diet.  This was  discussed with her.  She does not need additional treatment for  cholesterol.  She is not diabetic.  She does not smoke.  No other risk  factors needed to be addressed.  She will follow up with Dr. Jacki Cones at [facility] in 2 months' time.  If at  that point if she is doing well she may be discharged from the practice.  She will follow up with Dr. Joette Catching, her primary care physician,  in 2-4 weeks.  We will be available to assist in any way in the interim.  The patient should increase her activity slowly.      Deanna Artis. Sharene Skeans, M.D.  Electronically Signed     WHH/MEDQ  D:  05/12/2007  T:  05/13/2007  Job:  [id number]   cc:   Delaney Meigs, M.D.  Doylene Canning. Ladona Ridgel, MD

## 2010-10-01 NOTE — Assessment & Plan Note (Signed)
[facility]                         ELECTROPHYSIOLOGY OFFICE NOTE   NAME:Amber Love, Amber Love                     MRN:          [id number]  DATE:06/12/2006                            DOB:          1921/04/23    Amber Love was seen today in the clinic on 06/12/2006 for followup of  her Medtronic Model #303-B Sigma.  Date of implant was 06/20/2003 for  tachy-brady syndrome.  On interrogation of her device, today, her  battery voltage is 2.80, P waves measured greater than 2.8 millivolts  with an atrial capture threshold of 1 volt at 0.4 milliseconds and an  atrial lead impedance of 561.  R waves measured 15.68.  The 22.4  millivolts with a ventricular capture threshold of 1 volt at 0.4  milliseconds and a ventricular rate impedance of 718 ohms.  Underlying  rhythm today was a sinus bradycardia at about 4-5 beats per minute.  There were 330 mode switch episodes totaling 13.9% of the time and the  patient is not able to take Coumadin, but she is on Plavix and aspirin  on a regular basis. There are also 171 high-ventricular rate episodes  all very short in duration, just lasting only seconds.  No changes were  made in her parameters today.  She will continue her telephone checks  through [facility] with a return office visit in 1 year's time.      Altha Harm, LPN  Electronically Signed      Doylene Canning. Ladona Ridgel, MD  Electronically Signed   PO/MedQ  DD: 06/12/2006  DT: 06/12/2006  Job #: [id number]

## 2010-10-01 NOTE — Discharge Summary (Signed)
Amber Love, Amber Love                          ACCOUNT NO.:  [id number]   MEDICAL RECORD NO.:  [id number]                   PATIENT TYPE:  INP   LOCATION:  3703                                 FACILITY:  [facility]   PHYSICIAN:  Doylene Canning. Ladona Ridgel, M.D.               DATE OF BIRTH:  1920-05-29   DATE OF ADMISSION:  06/20/2003  DATE OF DISCHARGE:  06/21/2003                                 DISCHARGE SUMMARY   DISCHARGE DIAGNOSES:  1. Admitted with symptoms of presyncope and palpitations with progressive     effect, found to have tachybrady syndrome.  2. Status post placement of a DDD pacemaker by way of the left subclavian     vein on June 20, 2003.   SECONDARY DIAGNOSES:  1. History of atrial fibrillation with spontaneous conversion found November     2004 to sinus rhythm.  2. Hypertension.  3. Hypothyroidism.  4. History of bladder cancer with 3 separate surgeries.  5. Raynaud's disease.   PROCEDURE:  June 20, 2003, dual-chamber permanent pacemaker placed, DDD  mode, by way of left subclavian vein by Dr. Doylene Canning. Ladona Ridgel.  The patient  has had no complications after the surgery.  Her wound looks good.  There is  no drainage or erythema there.  She is not complaining of pain at that site.  She had interrogation of the pacer, both intraoperatively and on the morning  of postoperative day 1.  She does have some atrial pacing but she is  predominantly sinus rhythm.   DISCHARGE MEDICATIONS:  Amber Love will go home on the following  medications:  1. Lopressor 50 mg 1/2 tab in the morning, 1/2 tab in the evening.  2. Synthroid 50 mcg daily.  3. Fosamax 70 mg daily.  4. Enteric-coated aspirin 81 mg daily for pain.  5. She takes Tylenol 325 mg 1 to 2 tabs every 4 to 6 hours as needed.   SPECIAL DISCHARGE INSTRUCTIONS:  She has been given a device discharge sheet  which outlines device protocols including the fact that she cannot shower  for 1 week, although she was able to sponge  bathe.  She is cautioned against  wearing a brassiere which might irritate the pocket wound.  She is not to  drive for 1 week and the motion of the left upper extremity has been  carefully detailed for the next week.   BRIEF HISTORY:  Amber Love is an 77 year old female.  She has a past  medical history of hypertension.  She also has hypothyroidism and atrial  fibrillation diagnosed 6 years ago.  Her atrial fibrillation resolved  spontaneously, however, she has not been on Coumadin therapy.  She did have  these tachy-palpitations during a time when she was under great stress  caring for her husband who had cancer 6 years ago.  She has had no  tachyarrhythmias since that time, however, in the past 3  weeks, she has had  trouble with dizziness and episodes of presyncope.  She has a feeling of  palpitations and this has worsened over the past few days.  She states that  she feels as if a shade is pulled over her eyes and she becomes pale.  She  denies any outright syncope or chest pain.  She visited her primary care-  giver, Dr. Delaney Meigs, and was found to be in atrial fibrillation,  then she had a 2-second pause with an escaped beat, followed by a 4-second  pause.  She had presyncope symptoms at this time.  She will be admitted for  possible pacemaker placement for tachybrady syndrome.   HOSPITAL COURSE:  Amber Love was admitted to [facility],  June 19, 2002, with a diagnosis of tachybrady syndrome observed on visit  to Dr. Lysbeth Galas, her primary care-giver.  She has presyncope with this  dysrhythmia. On the morning of June 20, 2003, a DDD pacemaker was placed  by Dr. Doylene Canning. Ladona Ridgel.  She has tolerated the procedure well and will go  home postoperative day #1, June 21, 2003.  She was discharged with the  medications and she will have followup consisting of a pacer clinic visit,  July 09, 2003 at 9:45 in the morning and she will see Dr. Ladona Ridgel, Sep 19, 2003 at 10:45 in the morning.   LABORATORY STUDIES THIS ADMISSION:  Complete blood count on admission:  White cells 8, hemoglobin 12.1, hematocrit 35.4, platelets 268,000.  Serum  electrolytes:  Sodium 141, potassium 4.3, chloride 107, BUN 9, creatinine  08, glucose 111.  Troponin I studies are negative for ischemia or myocardial  injury.  Serum TSH is pending at the time of discharge.      Maple Mirza, P.A.                    Doylene Canning. Ladona Ridgel, M.D.    GM/MEDQ  D:  06/21/2003  T:  06/23/2003  Job:  [id number]   cc:   Doylene Canning. Ladona Ridgel, M.D.   Delaney Meigs, M.D.  [address]  [place]  [place] [address]  Fax: [phone]

## 2010-10-01 NOTE — Op Note (Signed)
Amber Love, Amber Love NO.:  [id number]   MEDICAL RECORD NO.:  [id number]                   PATIENT TYPE:  INP   LOCATION:  1828                                 FACILITY:  [facility]   PHYSICIAN:  Doylene Canning. Ladona Ridgel, M.D.               DATE OF BIRTH:  1920/06/22   DATE OF PROCEDURE:  06/20/2003  DATE OF DISCHARGE:                                 OPERATIVE REPORT   PROCEDURE PERFORMED:  Implantation of a dual-chamber pacemaker.   INDICATIONS:  Symptomatic tachy-brady syndrome.   I. INTRODUCTION:  The patient is a very pleasant 77 year old woman who has  had a remote history of atrial fibrillation in the past.  She also has a  history of thyroid disease and is on Synthroid.  She has hypertension.  She  was in her usual state of health until approximately three weeks ago when  she began experiencing episodes of intermittent dizziness associated with  near loss of consciousness.  These would last for a few seconds at a time  and come and go.  Over the last several days, they have increased in  frequency and severity.  She saw her primary physician, Dr. Lysbeth Galas, earlier  today.  An EKG demonstrated long pauses in and out of atrial fibrillation.  These pauses were up to 4 seconds in duration.  She is subsequently now  referred for permanent pacemaker insertion.   II. PROCEDURE:  After informed consent was obtained, the patient was taken  to the diagnostic EP lab in a fasting state.  After the usual preparation  and draping, fentanyl and midazolam were given for sedation.  A total of 30  mL of lidocaine was infiltrated into the left infraclavicular region.  A 5  cm incision was carried out over this region and electrocautery utilized to  dissect down to the fascial plane.  10 mL of contrast injected into the left  upper extremity venous system demonstrated a patent left subclavian vein.  It was subsequently punctured x2.  The Medtronic model #5076, 52 cm active  fixation pacing lead, serial #[id number], was advanced into the right  ventricle, and the Medtronic model #5076, 45 cm active fixation pacing lead,  serial #[id number], was advanced into the right atrium.  Mapping was  initially carried out in the right ventricle, and at the final site, the R  waves measured 12 mV, and the patient's threshold was 0.6 volts, or 0.5  msec, with a pacing impedance of 994 ohms, once the lead was actively fixed.  10-volt pacing did not stimulate the diaphragm.  At this point, attention  was then turned to the atrial lead.  It was placed in the right atrial  appendage where P waves measured between 2.5 and 3 mV.  The initial pacing  threshold was somewhat increased, and at the time of the implant, 1.9 volts  at 0.5 msec.  The pacing impedance was very  stable at 656 ohms.  10-volt  pacing in the right atrium did not stimulate the diaphragm.  At this point,  the leads were secured to the subpectoralis fascia with a figure-of-eight  silk suture.  In addition, the sew-in sleeve was secured with a silk suture.  Electrocautery was then utilized to make a subcutaneous pocket.  Kanamycin  irrigation was utilized to irrigate the pocket and electrocautery utilized  to assure hemostasis.  The Medtronic Sigma dual-chamber pacemaker, serial  [id number], was then connected to the atrial and ventricular pacing leads  and placed back in the subcutaneous pocket.  The generator was secured with  a silk suture.  Additional Kanamycin was utilized to irrigate the incision  and the incision then closed with a layer of 2-0 Vicryl, followed by a layer  of 3-0 Vicryl, followed by a layer of 4-0 Vicryl.  Benzoin was painted on  the skin.  Steri-Strips were applied and a pressure dressing placed, and the  patient returned to her room in satisfactory condition.   III. COMPLICATIONS:  There were no immediate procedural complications.   IV. RESULTS:  This demonstrates successful  implantation of a Medtronic dual-  chamber pacemaker in a patient with symptomatic tachy-brady syndrome and  long pauses of over four seconds.                                               Doylene Canning. Ladona Ridgel, M.D.    GWT/MEDQ  D:  06/20/2003  T:  06/22/2003  Job:  [id number]   cc:   Delaney Meigs, M.D.  [address]  [place]  [place] [address]  Fax: [phone]   Pricilla Riffle, M.D.   Vonna Kotyk Cochrein, Dr.

## 2010-10-01 NOTE — H&P (Signed)
NAMEKINSEY, KARCH NO.:  [id number]   MEDICAL RECORD NO.:  [id number]                   PATIENT TYPE:  INP   LOCATION:  1828                                 FACILITY:  [facility]   PHYSICIAN:  Doylene Canning. Ladona Ridgel, M.D.               DATE OF BIRTH:  October 30, 1920   DATE OF ADMISSION:  06/20/2003  DATE OF DISCHARGE:                                HISTORY & PHYSICAL   ADMISSION DIAGNOSIS:  Symptomatic tachy-brady with near syncope secondary to  long pauses.   HISTORY OF PRESENT ILLNESS:  The patient is a very pleasant 77 year old  woman  with a history of hypertension in the past and hypothyroidism who has  been on Synthroid. She has a history of a goiter. She has been euthyroid by  symptoms. She has had a history remotely of atrial fibrillation. This has  been well-controlled. Over the last two to three weeks she had recurrent  episodes of dizziness with near syncope. These were particularly severe  today and her granddaughters made her go see her primary physician Dr.  Lysbeth Galas. An EKG there demonstrated paroxysms of atrial fibrillation with long  post termination pauses of over 4 seconds. She was admitted for permanent  pacemaker insertion. The patient denies any changes in her medical therapy.   PAST MEDICAL HISTORY:  Also notable for bladder cancer and  a history of  Raynaud's phenomenon. She is status post surgery for her bladder cancer.   ADMISSION MEDICATIONS:  1. Lopressor 25 mg twice a day, which she has taken for some time.  2. Synthroid 50 mcg per day.  3. Fosamax once a week.  4. Aspirin.   SOCIAL HISTORY:  The patient lives alone in __________. 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 She denies tobacco abuse and she denies alcohol  abuse.   FAMILY HISTORY:  Mother died at age 64 of unknown causes and father died at  age 38 of kidney disease.   REVIEW OF SYSTEMS:  Positive for presyncope and palpitations as well as  arthralgias. The  rest of the review of systems has been noted by Chinita Pester  and is essentially negative except as noted in the HPI.   PHYSICAL EXAMINATION:  GENERAL:  She is a pleasant, well-appearing 82-year-  old woman  in no distress.  VITAL SIGNS:  The blood pressure was 150/70, pulse 65 and regular,  respirations 18. The weight was 147 pounds.  HEENT:  The patient is wearing glasses, but was normocephalic and  atraumatic. The pupils were equal and round. The oropharynx was moist. The  sclerae were anicteric.  NECK:  No jugular venous distention. There was a very small goiter present.  There was no thyroid bruits noted. Trachea was midline.  CARDIOVASCULAR:  Regular rate and rhythm with normal S1 and S2. I did not  appreciate any gallops today.  LUNGS:  Clear bilaterally to auscultation.  ABDOMEN:  Soft, nontender, nondistended. There was no organomegaly.  EXTREMITIES:  No cyanosis, clubbing, or edema.  NEUROLOGIC:  Nonfocal.   LABORATORY DATA:  EKG demonstrates atrial fibrillation with long pauses or  up to four seconds.   IMPRESSION:  1. Recurrent episodes of near syncope.  2. Documented bradycardia with long pauses while awake of over 4 seconds     secondary to paroxysmal atrial fibrillation.  3. Hypertension.  4. History of bladder cancer.   DISCUSSION:  I discussed the treatments options with Ms. Credit. Permanent  pacemaker insertion is indicated for symptomatic tachy-brady syndrome. The  risks, benefits, goals, and expectations of the procedure have been  discussed with the patient and she wishes to proceed.                                                Doylene Canning. Ladona Ridgel, M.D.    GWT/MEDQ  D:  06/20/2003  T:  06/20/2003  Job:  [id number]   cc:   Delaney Meigs, M.D.  [address]  [place]  [place] [address]  Fax: [phone]

## 2010-10-13 ENCOUNTER — Encounter: Payer: Self-pay | Admitting: Internal Medicine

## 2010-10-14 ENCOUNTER — Encounter: Payer: Self-pay | Admitting: Internal Medicine

## 2010-10-14 ENCOUNTER — Ambulatory Visit (INDEPENDENT_AMBULATORY_CARE_PROVIDER_SITE_OTHER): Payer: Medicare Other | Admitting: Internal Medicine

## 2010-10-14 DIAGNOSIS — I509 Heart failure, unspecified: Secondary | ICD-10-CM

## 2010-10-14 DIAGNOSIS — Z950 Presence of cardiac pacemaker: Secondary | ICD-10-CM

## 2010-10-14 DIAGNOSIS — I495 Sick sinus syndrome: Secondary | ICD-10-CM

## 2010-10-14 DIAGNOSIS — I4891 Unspecified atrial fibrillation: Secondary | ICD-10-CM

## 2010-10-14 DIAGNOSIS — I5032 Chronic diastolic (congestive) heart failure: Secondary | ICD-10-CM

## 2010-10-14 NOTE — Assessment & Plan Note (Signed)
She is now mostly in atrial fibrillation. She is minimally symptomatic. Her ventricular rate appears to be fairly well controlled. We'll plan to continue her current medical therapy.

## 2010-10-14 NOTE — Progress Notes (Signed)
HPI Amber Love returns today for followup. She has a history of hypertension and symptomatic bradycardia, status post permanent pacemaker insertion. She denies chest pain or shortness of breath. She admits to being sedentary. Her daughter notes that she watches a lot of TV. She has had no syncope. No peripheral edema. She denies palpitations. No symptomatic atrial fibrillation. Not on File   Current Outpatient Prescriptions  Medication Sig Dispense Refill  . amLODipine (NORVASC) 2.5 MG tablet TAKE ONE TABLET BY MOUTH ONE TIME DAILY  30 tablet  10  . escitalopram (LEXAPRO) 5 MG tablet Take 5 mg by mouth daily.        . furosemide (LASIX) 40 MG tablet        . Levothyroxine Sodium 100 MCG CAPS Take 1 capsule by mouth daily.        Marland Kitchen LORazepam (ATIVAN) 0.5 MG tablet Take 0.5 mg by mouth every 8 (eight) hours.        . metoprolol (LOPRESSOR) 50 MG tablet Take 50 mg by mouth 2 (two) times daily. 1/2 tab bid        . propoxyphene-acetaminophen (DARVOCET-N 100) 100-650 MG per tablet Take 1 tablet by mouth every 6 (six) hours as needed.        . simvastatin (ZOCOR) 20 MG tablet Take 20 mg by mouth at bedtime.        Marland Kitchen warfarin (COUMADIN) 5 MG tablet Take 5 mg by mouth daily.        Marland Kitchen DISCONTD: furosemide (LASIX) 40 MG tablet TAKE ONE TABLET BY MOUTH ONE TIME DAILY  30 tablet  10     Past Medical History  Diagnosis Date  . Bladder cancer   . CVA (cerebral infarction)   . Hyperthyroidism   . Bradycardia   . Paroxysmal atrial fibrillation   . Hypertension     ROS:   All systems reviewed and negative except as noted in the HPI.   Past Surgical History  Procedure Date  . Pacemaker insertion     medtronic     No family history on file.   History   Social History  . Marital Status: Widowed    Spouse Name: N/A    Number of Children: N/A  . Years of Education: N/A   Occupational History  . Not on file.   Social History Main Topics  . Smoking status: Never Smoker   .  Smokeless tobacco: Never Used  . Alcohol Use: No  . Drug Use: No  . Sexually Active: Not on file   Other Topics Concern  . Not on file   Social History Narrative  . No narrative on file     BP 116/67  Pulse 65  Wt 142 lb (64.411 kg)  Physical Exam:  elderly appearing NAD HEENT: Unremarkable Neck:  No JVD, no thyromegally Lymphatics:  No adenopathy Back:  No CVA tenderness Lungs:  Clear. Well-healed pacemaker incision. HEART:  Regular rate rhythm, no murmurs, no rubs, no clicks Abd:  Flat, positive bowel sounds, no organomegally, no rebound, no guarding Ext:  2 plus pulses, no edema, no cyanosis, no clubbing Skin:  No rashes no nodules Neuro:  CN II through XII intact, motor grossly intact  DEVICE  Normal device function.  See PaceArt for details.   Assess/Plan:

## 2010-10-14 NOTE — Assessment & Plan Note (Signed)
Her device is working normally. She has approximately 7 years of battery longevity. Will recheck in several months.

## 2010-10-14 NOTE — Assessment & Plan Note (Signed)
She is currently sedentary but clinical class II. She will continue her current medications. I have asked her to maintain a low-sodium diet. I've also asked her to start walking a regular basis.

## 2010-10-20 ENCOUNTER — Encounter: Payer: Self-pay | Admitting: Internal Medicine

## 2010-10-20 DIAGNOSIS — I495 Sick sinus syndrome: Secondary | ICD-10-CM

## 2011-01-19 ENCOUNTER — Encounter: Payer: Self-pay | Admitting: Internal Medicine

## 2011-01-19 DIAGNOSIS — I495 Sick sinus syndrome: Secondary | ICD-10-CM

## 2011-02-18 LAB — LIPID PANEL
Cholesterol: 144
HDL: 42
LDL Cholesterol: 94
Total CHOL/HDL Ratio: 3.4

## 2011-02-18 LAB — BASIC METABOLIC PANEL
CO2: 23
Calcium: 8.2 — ABNORMAL LOW
Chloride: 109
GFR calc Af Amer: >60
GFR calc Af Amer: >60
GFR calc non Af Amer: >60
Glucose, Bld: 141 — ABNORMAL HIGH
Potassium: 3.6
Sodium: 140
Sodium: 140

## 2011-02-18 LAB — DIFFERENTIAL
Basophils Absolute: 0
Eosinophils Absolute: 0.2
Eosinophils Relative: 3
Lymphocytes Relative: 12
Lymphocytes Relative: 15
Lymphocytes Relative: 19
Lymphs Abs: 0.9
Lymphs Abs: 1.3
Monocytes Absolute: 0.7
Monocytes Absolute: 0.8
Monocytes Relative: 10
Neutro Abs: 4.7
Neutro Abs: 7.5
Neutrophils Relative %: 69
Neutrophils Relative %: 80 — ABNORMAL HIGH

## 2011-02-18 LAB — APTT: aPTT: 32

## 2011-02-18 LAB — CK TOTAL AND CKMB (NOT AT ARMC)
Relative Index: INVALID
Total CK: 20
Total CK: 28
Total CK: 49

## 2011-02-18 LAB — TROPONIN I
Troponin I: 0.02
Troponin I: 0.02

## 2011-02-18 LAB — PROTIME-INR
INR: 1
Prothrombin Time: 13.7

## 2011-02-18 LAB — CBC
HCT: 31 — ABNORMAL LOW
HCT: 33.4 — ABNORMAL LOW
Hemoglobin: 11.1 — ABNORMAL LOW
Hemoglobin: 11.3 — ABNORMAL LOW
MCHC: 34.2
MCV: 92.3
Platelets: 206
RBC: 3.5 — ABNORMAL LOW
RBC: 3.59 — ABNORMAL LOW
RDW: 13.1
WBC: 6.4

## 2011-02-18 LAB — POCT CARDIAC MARKERS
Myoglobin, poc: 77.5
Operator id: 294521

## 2011-02-18 LAB — POCT I-STAT CREATININE: Creatinine, Ser: 1

## 2011-02-18 LAB — COMPREHENSIVE METABOLIC PANEL
CO2: 27
Calcium: 8.3 — ABNORMAL LOW
Creatinine, Ser: 0.8
GFR calc Af Amer: >60
GFR calc non Af Amer: >60
Glucose, Bld: 114 — ABNORMAL HIGH

## 2011-02-18 LAB — I-STAT 8, (EC8 V) (CONVERTED LAB)
Acid-Base Excess: 2
BUN: 14
Chloride: 108
Potassium: 3.6
pH, Ven: 7.346 — ABNORMAL HIGH

## 2011-02-18 LAB — URINALYSIS, ROUTINE W REFLEX MICROSCOPIC
Ketones, ur: 15 — AB
Nitrite: NEGATIVE
Protein, ur: NEGATIVE
Urobilinogen, UA: 1

## 2011-02-18 LAB — HEMOGLOBIN A1C: Hgb A1c MFr Bld: 5.9

## 2011-04-20 ENCOUNTER — Encounter: Payer: Self-pay | Admitting: Internal Medicine

## 2011-04-20 DIAGNOSIS — I495 Sick sinus syndrome: Secondary | ICD-10-CM

## 2011-07-20 ENCOUNTER — Encounter: Payer: Self-pay | Admitting: Internal Medicine

## 2011-07-20 DIAGNOSIS — I495 Sick sinus syndrome: Secondary | ICD-10-CM

## 2011-09-01 ENCOUNTER — Other Ambulatory Visit: Payer: Self-pay | Admitting: *Deleted

## 2011-09-01 MED ORDER — AMLODIPINE BESYLATE 2.5 MG PO TABS: 2.5000 mg | ORAL_TABLET | Freq: Every day | ORAL | Status: DC

## 2011-09-01 NOTE — Telephone Encounter (Signed)
Refilled amlodipine 2.5 mg 

## 2011-09-07 ENCOUNTER — Telehealth: Payer: Self-pay | Admitting: Internal Medicine

## 2011-09-07 NOTE — Telephone Encounter (Signed)
Received a call from Marin Olp NP with [facility] in [place], [phone].  She needs to speak to Dr. Ladona Ridgel about an alternative to the patient coumadin, as patient is not regulating on coumadin.  Patient has A-Fib.

## 2011-10-19 DIAGNOSIS — I495 Sick sinus syndrome: Secondary | ICD-10-CM

## 2011-11-07 ENCOUNTER — Encounter: Payer: Self-pay | Admitting: Internal Medicine

## 2011-11-07 ENCOUNTER — Ambulatory Visit (INDEPENDENT_AMBULATORY_CARE_PROVIDER_SITE_OTHER): Payer: Medicare Other | Admitting: Internal Medicine

## 2011-11-07 VITALS — BP 132/66 | HR 64 | Resp 18 | Ht 62.0 in | Wt 143.0 lb

## 2011-11-07 DIAGNOSIS — I4891 Unspecified atrial fibrillation: Secondary | ICD-10-CM

## 2011-11-07 DIAGNOSIS — Z950 Presence of cardiac pacemaker: Secondary | ICD-10-CM

## 2011-11-07 DIAGNOSIS — W19XXXA Unspecified fall, initial encounter: Secondary | ICD-10-CM

## 2011-11-07 LAB — PACEMAKER DEVICE OBSERVATION
AL AMPLITUDE: 1 mv
AL IMPEDENCE PM: 494 Ohm
BAMS-0001: 160 {beats}/min
RV LEAD AMPLITUDE: 11.2 mv
RV LEAD IMPEDENCE PM: 713 Ohm
RV LEAD THRESHOLD: 1 V

## 2011-11-07 NOTE — Progress Notes (Signed)
HPI Amber Love returns today for followup. She is a pleasant 78 yo woman with a h/o chronic atrial fib, symptomatic bradycardia, s/p PPM, and HTN. She has a h/o stroke and is on coumadin. The patient's daughter who is with her today notes that she has had multiple falls, though no broken bones or hard falls. She denies chest pain or sob. No syncope. Not on File   Current Outpatient Prescriptions  Medication Sig Dispense Refill  . amLODipine (NORVASC) 2.5 MG tablet Take 1 tablet (2.5 mg total) by mouth daily.  30 tablet  3  . Atorvastatin Calcium (LIPITOR PO) Take by mouth daily.      Marland Kitchen escitalopram (LEXAPRO) 5 MG tablet Take 5 mg by mouth daily.        . furosemide (LASIX) 40 MG tablet Take 40 mg by mouth as directed.       . Levothyroxine Sodium 100 MCG CAPS Take 1 capsule by mouth daily.        Marland Kitchen LORazepam (ATIVAN) 0.5 MG tablet Take 0.5 mg by mouth every 8 (eight) hours.        . metoprolol (LOPRESSOR) 50 MG tablet Take 25 mg by mouth 2 (two) times daily.       Marland Kitchen POTASSIUM CITRATE PO Take by mouth as directed.      . warfarin (COUMADIN) 5 MG tablet Take 5 mg by mouth daily.           Past Medical History  Diagnosis Date  . Bladder cancer   . CVA (cerebral infarction)   . Hyperthyroidism   . Bradycardia   . Paroxysmal atrial fibrillation   . Hypertension     ROS:   All systems reviewed and negative except as noted in the HPI.   Past Surgical History  Procedure Date  . Pacemaker insertion     medtronic     No family history on file.   History   Social History  . Marital Status: Widowed    Spouse Name: N/A    Number of Children: N/A  . Years of Education: N/A   Occupational History  . Not on file.   Social History Main Topics  . Smoking status: Never Smoker   . Smokeless tobacco: Never Used  . Alcohol Use: No  . Drug Use: No  . Sexually Active: Not on file   Other Topics Concern  . Not on file   Social History Narrative  . No narrative on file      BP 132/66  Pulse 64  Resp 18  Ht 5\' 2"  (1.575 m)  Wt 143 lb (64.864 kg)  BMI 26.15 kg/m2  Physical Exam:  Well appearing elderly woman, NAD HEENT: Unremarkable Neck:  No JVD, no thyromegally Lungs:  Clear with no wheezes, rales, or rhonchi. Well healed PPM incision. HEART:  Regular rate rhythm, no murmurs, no rubs, no clicks Abd:  soft, positive bowel sounds, no organomegally, no rebound, no guarding Ext:  2 plus pulses, no edema, no cyanosis, no clubbing Skin:  No rashes no nodules Neuro:  CN II through XII intact, motor grossly intact  DEVICE  Normal device function.  See PaceArt for details.   Assess/Plan:

## 2011-11-07 NOTE — Patient Instructions (Signed)
Your physician recommends that you schedule a follow-up appointment in: 1 year with Dr Taylor   

## 2011-11-07 NOTE — Assessment & Plan Note (Signed)
We discussed the risks of continuing coumadin. I have asked the patient to use her walker as she has been reluctant to do this. If she has one additional hard fall she will have to stop coumadin.

## 2011-11-07 NOTE — Assessment & Plan Note (Signed)
Her device is working normally. She is now in atrial fib 100% of the time. Will recheck in several months.

## 2011-11-07 NOTE — Assessment & Plan Note (Signed)
She is now in chronic atrial fib with well controlled ventricular rates. I have recommended she continue her current meds. I discussed the possibility of stopping her coumadin but for now will continue as she has previously had a stroke and her [place] score is 5.

## 2011-12-30 DIAGNOSIS — I509 Heart failure, unspecified: Secondary | ICD-10-CM

## 2012-01-13 ENCOUNTER — Other Ambulatory Visit: Payer: Self-pay | Admitting: Internal Medicine

## 2012-01-18 DIAGNOSIS — I495 Sick sinus syndrome: Secondary | ICD-10-CM

## 2012-04-14 ENCOUNTER — Emergency Department (HOSPITAL_COMMUNITY): Payer: Medicare Other

## 2012-04-14 ENCOUNTER — Inpatient Hospital Stay (HOSPITAL_COMMUNITY)
Admission: EM | Admit: 2012-04-14 | Discharge: 2012-04-26 | DRG: 480 | Disposition: A | Discharge status: Home Health | Payer: Medicare Other | Attending: Family Medicine | Admitting: Family Medicine

## 2012-04-14 ENCOUNTER — Encounter (HOSPITAL_COMMUNITY): Payer: Self-pay

## 2012-04-14 DIAGNOSIS — R601 Generalized edema: Secondary | ICD-10-CM

## 2012-04-14 DIAGNOSIS — I509 Heart failure, unspecified: Secondary | ICD-10-CM | POA: Diagnosis present

## 2012-04-14 DIAGNOSIS — F0390 Unspecified dementia without behavioral disturbance: Secondary | ICD-10-CM | POA: Diagnosis present

## 2012-04-14 DIAGNOSIS — D62 Acute posthemorrhagic anemia: Secondary | ICD-10-CM | POA: Diagnosis not present

## 2012-04-14 DIAGNOSIS — I5020 Unspecified systolic (congestive) heart failure: Secondary | ICD-10-CM

## 2012-04-14 DIAGNOSIS — D638 Anemia in other chronic diseases classified elsewhere: Secondary | ICD-10-CM | POA: Diagnosis present

## 2012-04-14 DIAGNOSIS — Z79899 Other long term (current) drug therapy: Secondary | ICD-10-CM

## 2012-04-14 DIAGNOSIS — S72009A Fracture of unspecified part of neck of unspecified femur, initial encounter for closed fracture: Secondary | ICD-10-CM

## 2012-04-14 DIAGNOSIS — Z66 Do not resuscitate: Secondary | ICD-10-CM | POA: Diagnosis present

## 2012-04-14 DIAGNOSIS — D72829 Elevated white blood cell count, unspecified: Secondary | ICD-10-CM | POA: Diagnosis present

## 2012-04-14 DIAGNOSIS — I5023 Acute on chronic systolic (congestive) heart failure: Secondary | ICD-10-CM | POA: Diagnosis present

## 2012-04-14 DIAGNOSIS — I4891 Unspecified atrial fibrillation: Secondary | ICD-10-CM | POA: Diagnosis present

## 2012-04-14 DIAGNOSIS — Z8673 Personal history of transient ischemic attack (TIA), and cerebral infarction without residual deficits: Secondary | ICD-10-CM

## 2012-04-14 DIAGNOSIS — D6959 Other secondary thrombocytopenia: Secondary | ICD-10-CM | POA: Diagnosis not present

## 2012-04-14 DIAGNOSIS — N170 Acute kidney failure with tubular necrosis: Secondary | ICD-10-CM | POA: Diagnosis not present

## 2012-04-14 DIAGNOSIS — I2510 Atherosclerotic heart disease of native coronary artery without angina pectoris: Secondary | ICD-10-CM | POA: Diagnosis present

## 2012-04-14 DIAGNOSIS — E872 Acidosis, unspecified: Secondary | ICD-10-CM | POA: Diagnosis not present

## 2012-04-14 DIAGNOSIS — E876 Hypokalemia: Secondary | ICD-10-CM | POA: Diagnosis not present

## 2012-04-14 DIAGNOSIS — S72142A Displaced intertrochanteric fracture of left femur, initial encounter for closed fracture: Secondary | ICD-10-CM | POA: Diagnosis present

## 2012-04-14 DIAGNOSIS — I10 Essential (primary) hypertension: Secondary | ICD-10-CM | POA: Diagnosis present

## 2012-04-14 DIAGNOSIS — S72143A Displaced intertrochanteric fracture of unspecified femur, initial encounter for closed fracture: Principal | ICD-10-CM | POA: Diagnosis present

## 2012-04-14 DIAGNOSIS — S72002A Fracture of unspecified part of neck of left femur, initial encounter for closed fracture: Secondary | ICD-10-CM

## 2012-04-14 DIAGNOSIS — D649 Anemia, unspecified: Secondary | ICD-10-CM | POA: Diagnosis present

## 2012-04-14 DIAGNOSIS — I959 Hypotension, unspecified: Secondary | ICD-10-CM | POA: Diagnosis not present

## 2012-04-14 DIAGNOSIS — Z7901 Long term (current) use of anticoagulants: Secondary | ICD-10-CM

## 2012-04-14 DIAGNOSIS — W19XXXA Unspecified fall, initial encounter: Secondary | ICD-10-CM

## 2012-04-14 DIAGNOSIS — Z01810 Encounter for preprocedural cardiovascular examination: Secondary | ICD-10-CM

## 2012-04-14 DIAGNOSIS — E861 Hypovolemia: Secondary | ICD-10-CM | POA: Diagnosis not present

## 2012-04-14 DIAGNOSIS — E039 Hypothyroidism, unspecified: Secondary | ICD-10-CM | POA: Diagnosis present

## 2012-04-14 DIAGNOSIS — J9601 Acute respiratory failure with hypoxia: Secondary | ICD-10-CM

## 2012-04-14 DIAGNOSIS — I214 Non-ST elevation (NSTEMI) myocardial infarction: Secondary | ICD-10-CM

## 2012-04-14 DIAGNOSIS — I5181 Takotsubo syndrome: Secondary | ICD-10-CM | POA: Diagnosis present

## 2012-04-14 DIAGNOSIS — I517 Cardiomegaly: Secondary | ICD-10-CM | POA: Diagnosis present

## 2012-04-14 DIAGNOSIS — J9600 Acute respiratory failure, unspecified whether with hypoxia or hypercapnia: Secondary | ICD-10-CM | POA: Diagnosis not present

## 2012-04-14 DIAGNOSIS — Z950 Presence of cardiac pacemaker: Secondary | ICD-10-CM

## 2012-04-14 DIAGNOSIS — E440 Moderate protein-calorie malnutrition: Secondary | ICD-10-CM | POA: Diagnosis present

## 2012-04-14 DIAGNOSIS — R296 Repeated falls: Secondary | ICD-10-CM | POA: Diagnosis present

## 2012-04-14 DIAGNOSIS — K7200 Acute and subacute hepatic failure without coma: Secondary | ICD-10-CM | POA: Diagnosis not present

## 2012-04-14 DIAGNOSIS — R9431 Abnormal electrocardiogram [ECG] [EKG]: Secondary | ICD-10-CM | POA: Diagnosis present

## 2012-04-14 DIAGNOSIS — N179 Acute kidney failure, unspecified: Secondary | ICD-10-CM | POA: Diagnosis not present

## 2012-04-14 DIAGNOSIS — I498 Other specified cardiac arrhythmias: Secondary | ICD-10-CM | POA: Diagnosis present

## 2012-04-14 DIAGNOSIS — Z6829 Body mass index (BMI) 29.0-29.9, adult: Secondary | ICD-10-CM

## 2012-04-14 DIAGNOSIS — I5032 Chronic diastolic (congestive) heart failure: Secondary | ICD-10-CM

## 2012-04-14 DIAGNOSIS — W07XXXA Fall from chair, initial encounter: Secondary | ICD-10-CM | POA: Diagnosis present

## 2012-04-14 LAB — HEPATIC FUNCTION PANEL
Albumin: 3.1 g/dL — ABNORMAL LOW (ref 3.5–5.2)
Alkaline Phosphatase: 85 U/L (ref 39–117)
Total Protein: 6.7 g/dL (ref 6.0–8.3)

## 2012-04-14 LAB — CBC WITH DIFFERENTIAL/PLATELET
Eosinophils Absolute: 0.1 10*3/uL (ref 0.0–0.7)
Eosinophils Relative: 1 % (ref 0–5)
Hemoglobin: 12 g/dL (ref 12.0–15.0)
Lymphs Abs: 1.4 10*3/uL (ref 0.7–4.0)
MCH: 30.8 pg (ref 26.0–34.0)
MCV: 92.1 fL (ref 78.0–100.0)
Monocytes Relative: 6 % (ref 3–12)
Neutrophils Relative %: 79 % — ABNORMAL HIGH (ref 43–77)
RBC: 3.9 MIL/uL (ref 3.87–5.11)

## 2012-04-14 LAB — BASIC METABOLIC PANEL
BUN: 16 mg/dL (ref 6–23)
GFR calc non Af Amer: 48 mL/min — ABNORMAL LOW (ref >90)
Glucose, Bld: 154 mg/dL — ABNORMAL HIGH (ref 70–99)
Potassium: 3.3 mEq/L — ABNORMAL LOW (ref 3.5–5.1)

## 2012-04-14 LAB — POCT I-STAT TROPONIN I: Troponin i, poc: 0.15 ng/mL (ref 0.00–0.08)

## 2012-04-14 LAB — SAMPLE TO BLOOD BANK

## 2012-04-14 MED ORDER — ASPIRIN 81 MG PO CHEW
CHEWABLE_TABLET | ORAL | Status: AC
  Administered 2012-04-14: 81 mg
  Filled 2012-04-14: qty 1

## 2012-04-14 MED ORDER — HEPARIN SODIUM (PORCINE) 5000 UNIT/ML IJ SOLN: 5000.0000 [IU] | Freq: Three times a day (TID) | INTRAMUSCULAR | Status: DC

## 2012-04-14 MED ORDER — MORPHINE SULFATE 4 MG/ML IJ SOLN
4.0000 mg | Freq: Once | INTRAMUSCULAR | Status: AC
  Administered 2012-04-14: 4 mg via INTRAVENOUS
  Filled 2012-04-14: qty 1

## 2012-04-14 MED ORDER — SODIUM CHLORIDE 0.9 % IJ SOLN
3.0000 mL | Freq: Two times a day (BID) | INTRAMUSCULAR | Status: DC
  Administered 2012-04-15 – 2012-04-25 (×16): 3 mL via INTRAVENOUS

## 2012-04-14 MED ORDER — METOPROLOL TARTRATE 12.5 MG HALF TABLET
12.5000 mg | ORAL_TABLET | Freq: Two times a day (BID) | ORAL | Status: DC
  Administered 2012-04-15 – 2012-04-18 (×7): 12.5 mg via ORAL
  Filled 2012-04-14 (×9): qty 1

## 2012-04-14 MED ORDER — LEVOTHYROXINE SODIUM 100 MCG PO TABS
100.0000 ug | ORAL_TABLET | Freq: Every day | ORAL | Status: DC
  Administered 2012-04-15 – 2012-04-26 (×13): 100 ug via ORAL
  Filled 2012-04-14 (×16): qty 1

## 2012-04-14 MED ORDER — SODIUM CHLORIDE 0.9 % IV BOLUS (SEPSIS)
1000.0000 mL | Freq: Once | INTRAVENOUS | Status: AC
  Administered 2012-04-14: 1000 mL via INTRAVENOUS

## 2012-04-14 MED ORDER — ASPIRIN EC 81 MG PO TBEC
81.0000 mg | DELAYED_RELEASE_TABLET | Freq: Every day | ORAL | Status: DC
  Administered 2012-04-15 – 2012-04-26 (×13): 81 mg via ORAL
  Filled 2012-04-14 (×14): qty 1

## 2012-04-14 MED ORDER — ONDANSETRON HCL 4 MG/2ML IJ SOLN
4.0000 mg | Freq: Once | INTRAMUSCULAR | Status: AC
  Administered 2012-04-14: 4 mg via INTRAVENOUS
  Filled 2012-04-14: qty 2

## 2012-04-14 MED ORDER — HYDROCODONE-ACETAMINOPHEN 5-325 MG PO TABS
1.0000 | ORAL_TABLET | ORAL | Status: DC | PRN
  Administered 2012-04-15 (×3): 1 via ORAL
  Administered 2012-04-15: 2 via ORAL
  Administered 2012-04-18 – 2012-04-22 (×7): 1 via ORAL
  Filled 2012-04-14 (×3): qty 1
  Filled 2012-04-14: qty 2
  Filled 2012-04-14 (×7): qty 1

## 2012-04-14 MED ORDER — SODIUM CHLORIDE 0.9 % IV SOLN
Freq: Once | INTRAVENOUS | Status: AC
  Administered 2012-04-14: 23:00:00 via INTRAVENOUS

## 2012-04-14 MED ORDER — LORAZEPAM 0.5 MG PO TABS
0.5000 mg | ORAL_TABLET | Freq: Two times a day (BID) | ORAL | Status: DC
  Administered 2012-04-15 (×2): 0.5 mg via ORAL
  Filled 2012-04-14 (×2): qty 1

## 2012-04-14 MED ORDER — SODIUM CHLORIDE 0.9 % IV SOLN
Freq: Once | INTRAVENOUS | Status: AC
  Administered 2012-04-14: 20 mL/h via INTRAVENOUS

## 2012-04-14 MED ORDER — ESCITALOPRAM OXALATE 10 MG PO TABS
10.0000 mg | ORAL_TABLET | Freq: Every evening | ORAL | Status: DC
  Administered 2012-04-15 – 2012-04-25 (×8): 10 mg via ORAL
  Filled 2012-04-14 (×14): qty 1

## 2012-04-14 NOTE — ED Notes (Signed)
Patient transported to X-ray 

## 2012-04-14 NOTE — ED Notes (Addendum)
Per EMS, Pt, from home, fell while trying to get out of a rocking chair onto hardwood flooring.  Fall was unwitnessed.  Pt hit head but did not lose consciousness.  Pt is complaining of severe L hip pain.  Obvious shortening in L leg.  Pt given 4mg  Morphine in route.  Vitals are stable.  Hx of dementia.

## 2012-04-14 NOTE — Consult Note (Signed)
Reason for Consult: Left hip pain post fall Referring Physician: Dr. Jeraldine Loots Consulting Physician:Isatu Macinnes E  Orthopedic Diagnosis: 1) closed displaced left basicervical versus high intertrochanteric hip fracture. 2) significant cardiac history with indwelling cardiac pacemaker (Medtronic) on xarelta for anticoagulant 3) dementia 4) hyperthyroidism.  Amber Love:Amber Love T Bedi is an 78 y.o. female with significant cardiac history and history of previous CVA and dementia. She has an indwelling cardiac pacemaker and is on xarelto for anti-coagulation. She fell this evening while her daughters were on the telephone. This appears to be a mechanical fall however exact mechanism is not verified. She had immediate pain and discomfort with movement of the left leg at the left hip. Brought by EMS to [facility] long emergency room where x-rays demonstrate a left intertrochanteric fracture that is high intertrochanter of low basicervical.   Past Medical History  Diagnosis Date  . Bladder cancer   . CVA (cerebral infarction)   . Hyperthyroidism   . Bradycardia   . Paroxysmal atrial fibrillation   . Hypertension     Past Surgical History  Procedure Date  . Pacemaker insertion     medtronic    History reviewed. No pertinent family history.  Social History:  reports that she has never smoked. She has never used smokeless tobacco. She reports that she does not drink alcohol or use illicit drugs.  Allergies: No Known Allergies  Medications: Prior to Admission:  (Not in a hospital admission)  Results for orders placed during the hospital encounter of 04/14/12 (from the past 48 hour(s))  CBC WITH DIFFERENTIAL     Status: Abnormal   Collection Time   04/14/12  5:18 PM      Component Value Range Comment   WBC 9.9  4.0 - 10.5 K/uL    RBC 3.90  3.87 - 5.11 MIL/uL    Hemoglobin 12.0  12.0 - 15.0 g/dL    HCT 30.8 (*) 65.7 - 46.0 %    MCV 92.1  78.0 - 100.0 fL    MCH 30.8  26.0 - 34.0 pg    MCHC 33.4   30.0 - 36.0 g/dL    RDW 84.6  96.2 - 95.2 %    Platelets 250  150 - 400 K/uL    Neutrophils Relative 79 (*) 43 - 77 %    Neutro Abs 7.8 (*) 1.7 - 7.7 K/uL    Lymphocytes Relative 14  12 - 46 %    Lymphs Abs 1.4  0.7 - 4.0 K/uL    Monocytes Relative 6  3 - 12 %    Monocytes Absolute 0.6  0.1 - 1.0 K/uL    Eosinophils Relative 1  0 - 5 %    Eosinophils Absolute 0.1  0.0 - 0.7 K/uL    Basophils Relative 0  0 - 1 %    Basophils Absolute 0.0  0.0 - 0.1 K/uL   BASIC METABOLIC PANEL     Status: Abnormal   Collection Time   04/14/12  5:18 PM      Component Value Range Comment   Sodium 135  135 - 145 mEq/L    Potassium 3.3 (*) 3.5 - 5.1 mEq/L    Chloride 94 (*) 96 - 112 mEq/L    CO2 30  19 - 32 mEq/L    Glucose, Bld 154 (*) 70 - 99 mg/dL    BUN 16  6 - 23 mg/dL    Creatinine, Ser 8.41  0.50 - 1.10 mg/dL    Calcium 9.3  8.4 -  10.5 mg/dL    GFR calc non Af Amer 48 (*) >90 mL/min    GFR calc Af Amer 56 (*) >90 mL/min   PROTIME-INR     Status: Abnormal   Collection Time   04/14/12  5:18 PM      Component Value Range Comment   Prothrombin Time 23.4 (*) 11.6 - 15.2 seconds    INR 2.19 (*) 0.00 - 1.49   SAMPLE TO BLOOD BANK     Status: Normal   Collection Time   04/14/12  5:18 PM      Component Value Range Comment   Blood Bank Specimen SAMPLE AVAILABLE FOR TESTING      Sample Expiration 04/17/2012     TYPE AND SCREEN     Status: Normal (Preliminary result)   Collection Time   04/14/12  6:18 PM      Component Value Range Comment   ABO/RH(D) A POS      Antibody Screen PENDING      Sample Expiration 04/17/2012       Dg Chest 1 View  04/14/2012  *RADIOLOGY REPORT*  Clinical Data: Comminuted left femoral neck fracture.  Preoperative respiratory evaluation.  CHEST - 1 VIEW  Comparison: Portable chest x-ray 07/31/2011, 07/02/2011 and CTA chest 07/02/2011 [facility].  Findings: Cardiac silhouette markedly enlarged but stable. Thoracic aorta atherosclerotic, unchanged.  Hilar  and mediastinal contours otherwise unremarkable.  Prominent bronchovascular markings diffusely and central peribronchial thickening, unchanged. Pulmonary vascularity normal without evidence of pulmonary edema. No new pulmonary parenchymal abnormalities.  IMPRESSION: Stable cardiomegaly and moderate changes of chronic bronchitis and/or asthma.  No acute cardiopulmonary disease.   Original Report Authenticated By: Hulan Saas, M.D.    Dg Hip Complete Left  04/14/2012  *RADIOLOGY REPORT*  Clinical Data: Fall with left hip pain.  LEFT HIP - COMPLETE 2+ VIEW  Comparison: None.  Findings: Acute, comminuted and angulated fracture of the proximal left femur is present with primarily intertrochanteric fracture planes.  There also may be a subcapital component of injury.  No dislocation.  The bony pelvis is intact.  IMPRESSION: Comminuted left hip fracture which appears to be primarily an intertrochanteric fracture.  There may be a subcapital component of fracture as well.   Original Report Authenticated By: Irish Lack, M.D.    Ct Head Wo Contrast  04/14/2012  *RADIOLOGY REPORT*  Clinical Data: Fall and acute mental status changes.  CT HEAD WITHOUT CONTRAST  Technique:  Contiguous axial images were obtained from the base of the skull through the vertex without contrast.  Comparison: Prior head CT on 07/31/2011 at [facility].  Findings: Advanced atrophy and small vessel ischemic changes are stable since the prior study.  The brain demonstrates no evidence of hemorrhage, infarction, edema, mass effect, extra-axial fluid collection, hydrocephalus or mass lesion.  The skull is unremarkable.  IMPRESSION: No acute findings.  Stable atrophy and small vessel disease.   Original Report Authenticated By: Irish Lack, M.D.     @ROS @ Blood pressure 133/64, pulse 83, temperature 98.3 F (36.8 C), temperature source Oral, resp. rate 20, height 5\' 1"  (1.549 m), weight 68.04 kg (150 lb), SpO2  95.00%. @PHYSEXAMBYAGE2 @  Orthopaedic Exam: Awake, alert oriented x82  78 year old female. She is supine on a stretcher in the emergency room. Left foot is externally rotated 20 and the left leg is shortened by 1 inch. She has exquisite pain with any  movement of the left lower extremity.the left leg and foot is warm neurovascularly normal. Plain  radiographs of the pelvis and left hip show a comminuted left eye intertrochanteric (Basicervical hip) fracture. There are no open wounds. Mild swelling of the left lateral hip in the area of the trochanter noted.   Assessment/Plan:  This 78 year old female has significant cardiac history with indwelling cardiac pacemaker and she is on xarelto. Her last dose this morning. Her INR is elevated. She has a left high intertrochanteric fracture that requires open reduction internal fixation with an IM trochanteric femoral nail with proximal lag screw and distal interlocking screws. Due to anti-coagulation she should be admitted stabilized and her INR and bleeding times stabilized normal (INR less than 1.7) Would then proceed with stabilization of the fracture with IM nail using small incision surgery. We'll plan to make her n.p.o. past midnight and hopes of possible intervention tomorrow if her coagulation studies normalized.   Ryken Paschal E 04/14/2012, 7:01 PM

## 2012-04-14 NOTE — ED Provider Notes (Addendum)
History     CSN: [id number]  Arrival date & time 04/14/12  1637   First MD Initiated Contact with Patient 04/14/12 1651      Chief Complaint  Patient presents with  . Fall  . Hip Pain    (Consider location/radiation/quality/duration/timing/severity/associated sxs/prior treatment) HPI The patient presents after mechanical fall.  The patient was on the phone with a family member, but falls unwitnessed.  Family member reports that the patient fell from a chair, struck her head, and landed on her left hip.  Since the event the patient has been nonambulatory.  The patient complains of pain focally in the left hip, and less so in the forehead.  The pain is severe.  The pain is improved with morphine, provided en route via EMS personnel.  The patient herself denies other complaints.  However, the patient is demented.  The patient's family member states that the patient is otherwise acting in a typical fashion for her.  Past Medical History  Diagnosis Date  . Bladder cancer   . CVA (cerebral infarction)   . Hyperthyroidism   . Bradycardia   . Paroxysmal atrial fibrillation   . Hypertension     Past Surgical History  Procedure Date  . Pacemaker insertion     medtronic    History reviewed. No pertinent family history.  History  Substance Use Topics  . Smoking status: Never Smoker   . Smokeless tobacco: Never Used  . Alcohol Use: No    OB History    Grav Para Term Preterm Abortions TAB SAB Ect Mult Living                  Review of Systems  Unable to perform ROS: Dementia    Allergies  Review of patient's allergies indicates no known allergies.  Home Medications   Current Outpatient Rx  Name  Route  Sig  Dispense  Refill  . AMLODIPINE BESYLATE 2.5 MG PO TABS      TAKE ONE TABLET BY MOUTH ONE TIME DAILY   30 tablet   6   . LIPITOR PO   Oral   Take by mouth daily.         Marland Kitchen ESCITALOPRAM OXALATE 5 MG PO TABS   Oral   Take 5 mg by mouth daily.           . FUROSEMIDE 40 MG PO TABS   Oral   Take 40 mg by mouth as directed.          Marland Kitchen LEVOTHYROXINE SODIUM 100 MCG PO CAPS   Oral   Take 1 capsule by mouth daily.           Marland Kitchen LORAZEPAM 0.5 MG PO TABS   Oral   Take 0.5 mg by mouth every 8 (eight) hours.           Marland Kitchen METOPROLOL TARTRATE 50 MG PO TABS   Oral   Take 25 mg by mouth 2 (two) times daily.          Marland Kitchen POTASSIUM CITRATE PO   Oral   Take by mouth as directed.         . WARFARIN SODIUM 5 MG PO TABS   Oral   Take 5 mg by mouth daily.             BP 133/64  Pulse 83  Temp 98.3 F (36.8 C) (Oral)  Resp 20  Ht 5\' 1"  (1.549 m)  Wt 150 lb (68.04 kg)  BMI 28.34 kg/m2  SpO2 95%  Physical Exam  Nursing note and vitals reviewed. Constitutional: She is oriented to person, place, and time. She appears well-developed and well-nourished. No distress.  HENT:  Head: Normocephalic and atraumatic.  Eyes: Conjunctivae normal and EOM are normal.  Cardiovascular: Normal rate and regular rhythm.   Pulmonary/Chest: Effort normal and breath sounds normal. No stridor. No respiratory distress.  Abdominal: She exhibits no distension.  Musculoskeletal: She exhibits no edema.       Right knee: Normal.       Left knee: Normal.       Right ankle: Normal.       Left ankle: Normal.       Legs:      The left knee and ankle are both grossly normal, the patient is unwilling to move them substantially due to the pain in her hip.  Neurological: She is alert and oriented to person, place, and time. No cranial nerve deficit.  Skin: Skin is warm and dry.  Psychiatric: She has a normal mood and affect.    ED Course  Procedures (including critical care time)  Labs Reviewed  CBC WITH DIFFERENTIAL - Abnormal; Notable for the following:    HCT 35.9 (*)     Neutrophils Relative 79 (*)     Neutro Abs 7.8 (*)     All other components within normal limits  BASIC METABOLIC PANEL - Abnormal; Notable for the following:    Potassium 3.3 (*)      Chloride 94 (*)     Glucose, Bld 154 (*)     GFR calc non Af Amer 48 (*)     GFR calc Af Amer 56 (*)     All other components within normal limits  PROTIME-INR - Abnormal; Notable for the following:    Prothrombin Time 23.4 (*)     INR 2.19 (*)     All other components within normal limits  SAMPLE TO BLOOD BANK   Ct Head Wo Contrast  04/14/2012  *RADIOLOGY REPORT*  Clinical Data: Fall and acute mental status changes.  CT HEAD WITHOUT CONTRAST  Technique:  Contiguous axial images were obtained from the base of the skull through the vertex without contrast.  Comparison: Prior head CT on 07/31/2011 at [facility].  Findings: Advanced atrophy and small vessel ischemic changes are stable since the prior study.  The brain demonstrates no evidence of hemorrhage, infarction, edema, mass effect, extra-axial fluid collection, hydrocephalus or mass lesion.  The skull is unremarkable.  IMPRESSION: No acute findings.  Stable atrophy and small vessel disease.   Original Report Authenticated By: Irish Lack, M.D.      No diagnosis found.  Cardiac 90 A. fib abnormal Pulse ox 99% room air normal    Date: 04/14/2012  Rate: 88  Rhythm: atrial fibrillation  QRS Axis: right  Intervals: afib  ST/T Wave abnormalities: nonspecific T wave changes  Conduction Disutrbances:nonspecific intraventricular conduction delay  Narrative Interpretation:   Old EKG Reviewed: changes noted  Flipped t waves since 2011-  ABNORMAL  I interpreted the XR, fracture of L hip.  I discussed the case w ortho: Nitka and the admitting hospitalist [facility]).  Given the cardiac Hx, the pacer, the Xaralto, I also discussed her case w Cardiology ([facility])   Update: The patient's troponin was elevated.  Repeat ECG - no sig changes from initial event   Uptake patient is resting comfortably.  MDM  This elderly female presents after a mechanical fall  with pain in her left hip.  On exam she is a deformity,  and her x-rays demonstrate an intertrochanteric fracture.  Given her comorbidities I discussed the case with both her admitting team and our cardiology service.  Notably the patient did have EKG changes from prior, and an elevated troponin.  However, the patient remained in no distress throughout her ED stay, with no description of chest pain.  She is admitted for definitive management.  Gerhard Munch, MD 04/14/12 1851  Gerhard Munch, MD 04/15/12 [id number]

## 2012-04-14 NOTE — Consult Note (Signed)
Reason for Consult: Pre-op cardiac evaluation  Referring Physician:   [name] is an 78 y.o. female.   HPI: 78 y/o female with dementia and AFIB currently been seen in consultation with Dr. Mahala Menghini for pre-op cardiac evaluation s/p fall with left hip fracture.  Patient is not able to give history or answer any question due to dementia.  Thus, history was obtained from health care providers and chart review.  She reportedly fell and has a left hip fracture. She underwent surgical evaluation with a tentative plan to go to surgery tomorrow.  Her ECG showed AFIB (88 bpm), T-wave inversion leads II, III, aVF, V3-V5. There is no non-paced ECG for comparison.  She has been on Metoprolol for rate control and Coumadin and Xarelto for anti-coagulation with INR of 2.3 today.  Her first set of cardiac marker shows a troponin-I (poc) of 0.15. She is currently asymptomatic and is clinically and hemodynamically stable.  Past Medical History  Diagnosis Date  . Bladder cancer   . CVA (cerebral infarction)   . Hyperthyroidism   . Bradycardia   . Paroxysmal atrial fibrillation   . Hypertension     Past Surgical History  Procedure Date  . Pacemaker insertion     medtronic    Family History  Problem Relation Age of Onset  . Renal Disease Father     Social History:  reports that she has never smoked. She has never used smokeless tobacco. She reports that she does not drink alcohol or use illicit drugs.  Allergies: No Known Allergies  Medications:   Lasix 40 mg qd Levothyroxine 100 microgram qd Metoprolol 25 mg bid Coumadin 5 mg qd Xarelto 20 mg qd Amlodipine 2.5 mg qd  Results for orders placed during the hospital encounter of 04/14/12 (from the past 48 hour(s))  CBC WITH DIFFERENTIAL     Status: Abnormal   Collection Time   04/14/12  5:18 PM      Component Value Range Comment   WBC 9.9  4.0 - 10.5 K/uL    RBC 3.90  3.87 - 5.11 MIL/uL    Hemoglobin 12.0  12.0 - 15.0 g/dL    HCT  16.1 (*) 09.6 - 46.0 %    MCV 92.1  78.0 - 100.0 fL    MCH 30.8  26.0 - 34.0 pg    MCHC 33.4  30.0 - 36.0 g/dL    RDW 04.5  40.9 - 81.1 %    Platelets 250  150 - 400 K/uL    Neutrophils Relative 79 (*) 43 - 77 %    Neutro Abs 7.8 (*) 1.7 - 7.7 K/uL    Lymphocytes Relative 14  12 - 46 %    Lymphs Abs 1.4  0.7 - 4.0 K/uL    Monocytes Relative 6  3 - 12 %    Monocytes Absolute 0.6  0.1 - 1.0 K/uL    Eosinophils Relative 1  0 - 5 %    Eosinophils Absolute 0.1  0.0 - 0.7 K/uL    Basophils Relative 0  0 - 1 %    Basophils Absolute 0.0  0.0 - 0.1 K/uL   BASIC METABOLIC PANEL     Status: Abnormal   Collection Time   04/14/12  5:18 PM      Component Value Range Comment   Sodium 135  135 - 145 mEq/L    Potassium 3.3 (*) 3.5 - 5.1 mEq/L    Chloride 94 (*) 96 - 112 mEq/L  CO2 30  19 - 32 mEq/L    Glucose, Bld 154 (*) 70 - 99 mg/dL    BUN 16  6 - 23 mg/dL    Creatinine, Ser 1.61  0.50 - 1.10 mg/dL    Calcium 9.3  8.4 - 09.6 mg/dL    GFR calc non Af Amer 48 (*) >90 mL/min    GFR calc Af Amer 56 (*) >90 mL/min   PROTIME-INR     Status: Abnormal   Collection Time   04/14/12  5:18 PM      Component Value Range Comment   Prothrombin Time 23.4 (*) 11.6 - 15.2 seconds    INR 2.19 (*) 0.00 - 1.49   SAMPLE TO BLOOD BANK     Status: Normal   Collection Time   04/14/12  5:18 PM      Component Value Range Comment   Blood Bank Specimen SAMPLE AVAILABLE FOR TESTING      Sample Expiration 04/17/2012     TYPE AND SCREEN     Status: Normal   Collection Time   04/14/12  6:18 PM      Component Value Range Comment   ABO/RH(D) A POS      Antibody Screen NEG      Sample Expiration 04/17/2012     ABO/RH     Status: Normal   Collection Time   04/14/12  6:18 PM      Component Value Range Comment   ABO/RH(D) A POS     PROTIME-INR     Status: Abnormal   Collection Time   04/14/12  7:45 PM      Component Value Range Comment   Prothrombin Time 24.2 (*) 11.6 - 15.2 seconds    INR 2.29 (*) 0.00 -  1.49   HEPATIC FUNCTION PANEL     Status: Abnormal   Collection Time   04/14/12  7:45 PM      Component Value Range Comment   Total Protein 6.7  6.0 - 8.3 g/dL    Albumin 3.1 (*) 3.5 - 5.2 g/dL    AST 18  0 - 37 U/L    ALT 11  0 - 35 U/L    Alkaline Phosphatase 85  39 - 117 U/L    Total Bilirubin 0.5  0.3 - 1.2 mg/dL    Bilirubin, Direct 0.2  0.0 - 0.3 mg/dL    Indirect Bilirubin 0.3  0.3 - 0.9 mg/dL   TROPONIN I     Status: Abnormal   Collection Time   04/14/12  7:45 PM      Component Value Range Comment   Troponin I 0.46 (*) <0.30 ng/mL   POCT I-STAT TROPONIN I     Status: Abnormal   Collection Time   04/14/12  7:48 PM      Component Value Range Comment   Troponin i, poc 0.15 (*) 0.00 - 0.08 ng/mL    Comment NOTIFIED PHYSICIAN      Comment 3            CK TOTAL AND CKMB     Status: Normal   Collection Time   04/14/12  7:55 PM      Component Value Range Comment   Total CK 49  7 - 177 U/L    CK, MB 3.0  0.3 - 4.0 ng/mL    Relative Index RELATIVE INDEX IS INVALID  0.0 - 2.5     Dg Chest 1 View  04/14/2012  *RADIOLOGY REPORT*  Clinical  Data: Comminuted left femoral neck fracture.  Preoperative respiratory evaluation.  CHEST - 1 VIEW  Comparison: Portable chest x-ray 07/31/2011, 07/02/2011 and CTA chest 07/02/2011 [facility].  Findings: Cardiac silhouette markedly enlarged but stable. Thoracic aorta atherosclerotic, unchanged.  Hilar and mediastinal contours otherwise unremarkable.  Prominent bronchovascular markings diffusely and central peribronchial thickening, unchanged. Pulmonary vascularity normal without evidence of pulmonary edema. No new pulmonary parenchymal abnormalities.  IMPRESSION: Stable cardiomegaly and moderate changes of chronic bronchitis and/or asthma.  No acute cardiopulmonary disease.   Original Report Authenticated By: Hulan Saas, M.D.    Dg Hip Complete Left  04/14/2012  *RADIOLOGY REPORT*  Clinical Data: Fall with left hip pain.  LEFT  HIP - COMPLETE 2+ VIEW  Comparison: None.  Findings: Acute, comminuted and angulated fracture of the proximal left femur is present with primarily intertrochanteric fracture planes.  There also may be a subcapital component of injury.  No dislocation.  The bony pelvis is intact.  IMPRESSION: Comminuted left hip fracture which appears to be primarily an intertrochanteric fracture.  There may be a subcapital component of fracture as well.   Original Report Authenticated By: Irish Lack, M.D.    Ct Head Wo Contrast  04/14/2012  *RADIOLOGY REPORT*  Clinical Data: Fall and acute mental status changes.  CT HEAD WITHOUT CONTRAST  Technique:  Contiguous axial images were obtained from the base of the skull through the vertex without contrast.  Comparison: Prior head CT on 07/31/2011 at [facility].  Findings: Advanced atrophy and small vessel ischemic changes are stable since the prior study.  The brain demonstrates no evidence of hemorrhage, infarction, edema, mass effect, extra-axial fluid collection, hydrocephalus or mass lesion.  The skull is unremarkable.  IMPRESSION: No acute findings.  Stable atrophy and small vessel disease.   Original Report Authenticated By: Irish Lack, M.D.     Review of Systems  Unable to perform ROS: dementia   Blood pressure 121/93, pulse 74, temperature 99.2 F (37.3 C), temperature source Oral, resp. rate 13, height 5\' 4"  (1.626 m), weight 66.9 kg (147 lb 7.8 oz), SpO2 95.00%. Physical Exam  Constitutional: She appears well-developed and well-nourished. No distress.  HENT:  Head: Normocephalic and atraumatic.  Eyes: EOM are normal. Right eye exhibits no discharge. No scleral icterus.  Neck: No JVD present. No tracheal deviation present. No thyromegaly present.  Cardiovascular: Exam reveals no friction rub.   Murmur heard.      Irregularly irregular rhythm with 2/6 SEM at left sternal border.  Respiratory: No stridor. No respiratory distress. She has  no wheezes. She has no rales. She exhibits no tenderness.  GI: She exhibits no distension. There is no tenderness. There is no rebound.  Musculoskeletal: She exhibits no edema and no tenderness.  Neurological:       Alert and oriented to name only.  Skin: No rash noted. She is not diaphoretic. No erythema.    Assessment/Plan:  1. Atrial fibrillation with normal ventricular response rate 2. Left hip fracture 3. NSTEMI 4. HTN 5. Dementia  I recommend the following:  1. Proceed with the planned surgery with peri-operative low-dose Aspirin and beta-blocker 2. D/C Xarelto pre-op. May restart Xarelto for anti-coagulation when if/when it is safe from the surgical stand point 3. NSTEMI is probably due to stress of fall. She likely has CAD at her age, but we will treat her medically for now with low-dose Aspirin, high-intensity Statin (Lipitor 40-80 mg qhs) and Metoprolol 4. Obtain a transthoracic echocardiogram to evaluate  her LV function.  Creedence Kunesh E 04/14/2012, 11:21 PM

## 2012-04-14 NOTE — ED Notes (Signed)
[id number]<AV> Expected date:<BR> Expected time:<BR> Means of arrival:<BR> Comments:<BR> [facility]

## 2012-04-14 NOTE — ED Notes (Addendum)
Pt's O2 sat dropped to mid-80s.  Pt put on 2L [place].  O2 Sat back up to 100%.

## 2012-04-14 NOTE — H&P (Addendum)
[facility] History and Physical  Amber Love:[id number] DOB: [birth year] DOA: 04/14/2012  Referring physician: lOCKWOOD PCP: Josue Hector, MD  Specialists: rEQUESTED EDP TO CONSULT cARDIOLOGYU DR. nITKA OF SURGERY HAS ALREADY BEEN CONTACTED BY eD FOR POTNENTUIAL SURGERY  Chief ComplainT: MechanicAL FALL  HPI: Amber Love is a 78 y.o. female came to [facility] ed 04/14/2012 with H/O mechanical fall presented with a fall.  She  is an exceedingly poor and historian and most of the history has to be obtained from emergency room notes and the granddaughter and at the bedside PAtient allegedly was on the phone with a gradndaughter and the granddaughter heard a bump.  Another and Dr. that  lives with her ran to see what was happeneing.  She was apparently she was in a rocking chair and got up in the middle of a conversation and she noted that the patient had fallen.  The EMt's were there and she was palced on a [place].  She was in singificant pain and thought she was about to die . It was noted that she had hit her head and had also fractured her hip She was perfectly normal on the phonme- But has for past couple of days been having some increasing alteration of her speech pattern and was thought to have slurred speech as well.  Grand-daughters were concerned that she had a TIa but this deficit seemed to resolve quickly-there has not seemed to be any real focal deficit to the granddaughter in the room. The allegedly was no seizures or focal deficit prior to the fall There was no inciting event. Patient was perfectly normal prior to fall. Unable to ascertain a review of systems given she is severely demented  Patient was worked up in ED with the following  CT scan of head showed no acute abnormality Chest x-ray 1 view showed stable cardiomegaly and moderate changes of chronic bronchitis or asthma-no acute cardiopulmonary disease Hip x-ray complete showed a comminuted left hip  fracture which is an intertrochanteric fracture of subcapital component of this is mentioned Potassium 3.3 Beard creatinine at baseline of 16 and 1.0 hematocrit 35.9 INR 2.19 Random glucose 154  Review of Systems:   Past Medical History  Diagnosis Date  . Bladder cancer   . CVA (cerebral infarction)   . Hyperthyroidism   . Bradycardia   . Paroxysmal atrial fibrillation   . Hypertension    Chart review  Admission 2/4/5 for Implantation of PPM [DDD] Medtronic model #5076, 52 cm active fixation pacing lead, serial #[id number], for symptomatic bradycardia  H/o Afib with spontaneous conversion 03/2003  H/o bladder cancer c 3 surgeries  [per 2/4/5 note]  H/o [place] cva 05/09/07-Given tPA  Admission 08/13/09 for A/CHF  Multiple falls noted per out-patient Cardiology follow up visit 11/07/11-last pacer check documented 03/30/10  Past Surgical History  Procedure Date  . Pacemaker insertion     medtronic   Social History:  History   Social History Narrative   Retired Education officer, environmental; independent in all ADLs.  She has cared for her daughter who has multiple medical problems.  No tobacco nor alcohol use.     No Known Allergies  Family History  Problem Relation Age of Onset  . Renal Disease Father    Prior to Admission medications   Medication Sig Start Date End Date Taking? Authorizing Provider  amLODipine (NORVASC) 2.5 MG tablet Take 2.5 mg by mouth every evening.   Yes Historical Provider, MD  atorvastatin (LIPITOR) 10 MG  tablet Take 10 mg by mouth every morning.   Yes Historical Provider, MD  escitalopram (LEXAPRO) 10 MG tablet Take 10 mg by mouth every evening.   Yes Historical Provider, MD  furosemide (LASIX) 40 MG tablet Take 40 mg by mouth every morning.  08/08/10  Yes Luis Abed, MD  levothyroxine (SYNTHROID, LEVOTHROID) 100 MCG tablet Take 100 mcg by mouth daily before breakfast.   Yes Historical Provider, MD  LORazepam (ATIVAN) 0.5 MG tablet Take 0.5-1 mg by mouth 2  (two) times daily. Take 1 tablet in the morning and 2 tablets at bedtime   Yes Historical Provider, MD  metoprolol (LOPRESSOR) 50 MG tablet Take 25 mg by mouth 2 (two) times daily.    Yes Historical Provider, MD  potassium citrate (UROCIT-K) 10 MEQ (1080 MG) SR tablet Take 10 mEq by mouth every morning. Break tablet in half. Patient with get choked on whole tablet   Yes Historical Provider, MD  Rivaroxaban (XARELTO) 20 MG TABS Take 20 mg by mouth every morning.   Yes Historical Provider, MD   Physical Exam: Filed Vitals:   04/14/12 1647 04/14/12 1705  BP: 133/64   Pulse: 83   Temp: 98.3 F (36.8 C)   TempSrc: Oral   Resp: 20   Height:  5\' 1"  (1.549 m)  Weight:  68.04 kg (150 lb)  SpO2: 95%    Alert pleasant oriented Caucasian female with is severely demented No arcus senilis no strabismus, smile is symmetric uvula seems midline poor dentition with dentures No JVD notable no carotid bruit S1-S2 irregularly irregular rhythm Pacemaker noted to be in situ Chest not examined given pain in patient unable to sit up for me to listen Abdomen soft nontender nondistended Left hip seems slightly externally rotated pulses are bounding in both lower dorsalis pedis pulses Neurologically she appears to be intact however she has subtle weakness to the left upper bicep tricep with motion. Compared to the right side She cannot lift her left leg office is secondary to pain and has referred pain when she lifts her right leg but she doesn't have vital 5 power. I'm not able to perform vision by direct confrontation or cerebellar signs as patient does not understand the same   Labs on Admission:  Basic Metabolic Panel:  Lab 04/14/12 4098  NA 135  K 3.3*  CL 94*  CO2 30  GLUCOSE 154*  BUN 16  CREATININE 1.00  CALCIUM 9.3  MG --  PHOS --   Liver Function Tests: No results found for this basename: AST:5,ALT:5,ALKPHOS:5,BILITOT:5,PROT:5,ALBUMIN:5 in the last 168 hours No results found for this  basename: LIPASE:5,AMYLASE:5 in the last 168 hours No results found for this basename: AMMONIA:5 in the last 168 hours CBC:  Lab 04/14/12 1718  WBC 9.9  NEUTROABS 7.8*  HGB 12.0  HCT 35.9*  MCV 92.1  PLT 250   Cardiac Enzymes: No results found for this basename: CKTOTAL:5,CKMB:5,CKMBINDEX:5,TROPONINI:5 in the last 168 hours  BNP (last 3 results) No results found for this basename: PROBNP:3 in the last 8760 hours CBG: No results found for this basename: GLUCAP:5 in the last 168 hours  Radiological Exams on Admission: Dg Chest 1 View  04/14/2012  *RADIOLOGY REPORT*  Clinical Data: Comminuted left femoral neck fracture.  Preoperative respiratory evaluation.  CHEST - 1 VIEW  Comparison: Portable chest x-ray 07/31/2011, 07/02/2011 and CTA chest 07/02/2011 [facility].  Findings: Cardiac silhouette markedly enlarged but stable. Thoracic aorta atherosclerotic, unchanged.  Hilar and mediastinal contours otherwise  unremarkable.  Prominent bronchovascular markings diffusely and central peribronchial thickening, unchanged. Pulmonary vascularity normal without evidence of pulmonary edema. No new pulmonary parenchymal abnormalities.  IMPRESSION: Stable cardiomegaly and moderate changes of chronic bronchitis and/or asthma.  No acute cardiopulmonary disease.   Original Report Authenticated By: Hulan Saas, M.D.    Dg Hip Complete Left  04/14/2012  *RADIOLOGY REPORT*  Clinical Data: Fall with left hip pain.  LEFT HIP - COMPLETE 2+ VIEW  Comparison: None.  Findings: Acute, comminuted and angulated fracture of the proximal left femur is present with primarily intertrochanteric fracture planes.  There also may be a subcapital component of injury.  No dislocation.  The bony pelvis is intact.  IMPRESSION: Comminuted left hip fracture which appears to be primarily an intertrochanteric fracture.  There may be a subcapital component of fracture as well.   Original Report Authenticated By: Irish Lack, M.D.    Ct Head Wo Contrast  04/14/2012  *RADIOLOGY REPORT*  Clinical Data: Fall and acute mental status changes.  CT HEAD WITHOUT CONTRAST  Technique:  Contiguous axial images were obtained from the base of the skull through the vertex without contrast.  Comparison: Prior head CT on 07/31/2011 at [facility].  Findings: Advanced atrophy and small vessel ischemic changes are stable since the prior study.  The brain demonstrates no evidence of hemorrhage, infarction, edema, mass effect, extra-axial fluid collection, hydrocephalus or mass lesion.  The skull is unremarkable.  IMPRESSION: No acute findings.  Stable atrophy and small vessel disease.   Original Report Authenticated By: Irish Lack, M.D.     EKG: Independently reviewed. Atrial fibrillation rate about 90, QRS axis next degrees, diffuse ST-T wave inversions in V3 through 5. Compared to prior EKG done 10/08/2009 these are new. There appears to be also inverted T waves in lead 3 aVF and V2 which are contiguous leads.  Assessment/Plan Principal Problem:  *Abnormal EKG Active Problems:  HYPOTHYROIDISM  HYPERTENSION  PAROXYSMAL ATRIAL FIBRILLATION  BRADYCARDIA  Chronic diastolic heart failure  Pacemaker  Falls  Displaced intertrochanteric fracture of left femur   1. ?NSETMI-she has contiguous T-wave inversions in V3 through V4 and new EKG inversions in leads 3 stool aVF as well which are contiguous leads- reorder a stat EKG and stat troponin to determine if the rationale for her fall was potentially cardiogenic. I. have already consulted cardiology to see her given her age of fibrillation and clearance for surgery and I will speak to them regarding her EKG repeat if this does turn out to be consistent. She potentially will need heparin drip and step down placement if this turns out to be positive-I will start low-dose beta blocker if her pressure can tolerate the same. I will repeat an echocardiogram stat on this  admission-I have discussed with the Fellow on-call who states to stop the Xarelto and give Aspirin now and he will see her for clearance for surgery-distance appreciated in advance 2. Intertrochanteric fracture of left femur-appreciate Dr. Barbaraann Faster assistance in her case-she may potentially need to have the pacemaker turned off to be under the C-arm per the granddaughter's discussion with me and cardiology is currently requested to arrange this if needed-she will eat tonight if EKG and other findings turn out to be normal. 3. Chronic diastolic heart failure, last echocardiogram 2011 showing EF 45 50% with some regurgitation-seems compensated at this time-start IV fluids 50 cc per hour-echocardiogram to be repeated his admission 4. ? Recurrent TIA-CT scan of the head was negative. Given her weakness  and other issues however, I feel it is prudent to involve neurology on a nonemergent basis in her case and her anticoagulation definitely does need to be addressed between cardiology and neurology-she does have an elevated [place] score of over 2 and will need anticoagulation throughout however she is proven herself 2B at risk for serious bleeding if she does fall given she had a hip fracture. 5. Elevated INR-unclear etiology. I will get LFTs and repeat an INR stat 6. Sick sinus syndrome, Chad2Vasc2~8, status post pacemaker placement. Her cardiologist has a DDD pacer in her and this was last interrogated earlier this year. I do not really see any specific pacer spikes which were present previously so this may need to be interrogated by Medtr[employer] however I will leave this up cardiologist on call to determine 7. Hypothyroidism-get a TSH once patient stabilized 8. Impaired glucose tolerance-patient has no history of diabetes however her and sugar is 154-this may represent a stress response. We will get an A1c 9. Stage V dementia-would recommend an outpatient goals of care.    Amend--patient's point-of-care troponin  was 0.15-whether this represents an NSTEMI or not will leave up cardiologist to determine. Cycle cardiac markers and appreciate input from cardiologist  8:27 PM   Cardiology has been consulted, orthopedics has actually seen the patient-would recommend neurology input in the morning   Code Status: DO NOT RESUSCITATE  Family Communication: Spoke at great length to granddaughter at bedside and seems to understand overall issues going on.  Disposition Plan: Step down   Time spent: 100 minutes  Mahala Menghini [facility] Pager [phone]  If 7PM-7AM, please contact night-coverage [email or web address] Password [facility] 04/14/2012, 6:49 PM

## 2012-04-15 DIAGNOSIS — I214 Non-ST elevation (NSTEMI) myocardial infarction: Secondary | ICD-10-CM | POA: Diagnosis present

## 2012-04-15 DIAGNOSIS — R9431 Abnormal electrocardiogram [ECG] [EKG]: Secondary | ICD-10-CM

## 2012-04-15 DIAGNOSIS — I369 Nonrheumatic tricuspid valve disorder, unspecified: Secondary | ICD-10-CM

## 2012-04-15 DIAGNOSIS — I4891 Unspecified atrial fibrillation: Secondary | ICD-10-CM

## 2012-04-15 DIAGNOSIS — D72829 Elevated white blood cell count, unspecified: Secondary | ICD-10-CM | POA: Diagnosis present

## 2012-04-15 DIAGNOSIS — D649 Anemia, unspecified: Secondary | ICD-10-CM | POA: Diagnosis present

## 2012-04-15 LAB — CBC
HCT: 32.6 % — ABNORMAL LOW (ref 36.0–46.0)
MCHC: 33.4 g/dL (ref 30.0–36.0)
MCV: 93.7 fL (ref 78.0–100.0)
Platelets: 221 10*3/uL (ref 150–400)
RDW: 12.3 % (ref 11.5–15.5)
WBC: 14.1 10*3/uL — ABNORMAL HIGH (ref 4.0–10.5)

## 2012-04-15 LAB — GLUCOSE, CAPILLARY
Glucose-Capillary: 149 mg/dL — ABNORMAL HIGH (ref 70–99)
Glucose-Capillary: 130 mg/dL — ABNORMAL HIGH (ref 70–99)

## 2012-04-15 LAB — COMPREHENSIVE METABOLIC PANEL
ALT: 11 U/L (ref 0–35)
Alkaline Phosphatase: 83 U/L (ref 39–117)
BUN: 17 mg/dL (ref 6–23)
Chloride: 98 mEq/L (ref 96–112)
GFR calc Af Amer: 54 mL/min — ABNORMAL LOW (ref >90)
Glucose, Bld: 159 mg/dL — ABNORMAL HIGH (ref 70–99)
Potassium: 3.9 mEq/L (ref 3.5–5.1)
Sodium: 137 mEq/L (ref 135–145)
Total Bilirubin: 0.4 mg/dL (ref 0.3–1.2)
Total Protein: 6.6 g/dL (ref 6.0–8.3)

## 2012-04-15 LAB — HEMOGLOBIN A1C
Hgb A1c MFr Bld: 6.2 % — ABNORMAL HIGH (ref <5.7)
Mean Plasma Glucose: 131 mg/dL — ABNORMAL HIGH (ref <117)

## 2012-04-15 LAB — PROTIME-INR: INR: 1.75 — ABNORMAL HIGH (ref 0.00–1.49)

## 2012-04-15 MED ORDER — MORPHINE SULFATE 4 MG/ML IJ SOLN
4.0000 mg | INTRAMUSCULAR | Status: DC | PRN
  Administered 2012-04-15 – 2012-04-16 (×4): 4 mg via INTRAVENOUS
  Filled 2012-04-15 (×4): qty 1

## 2012-04-15 MED ORDER — LORAZEPAM 2 MG/ML IJ SOLN
0.5000 mg | Freq: Two times a day (BID) | INTRAMUSCULAR | Status: DC | PRN
  Administered 2012-04-21 – 2012-04-24 (×2): 0.5 mg via INTRAVENOUS
  Filled 2012-04-15 (×2): qty 1

## 2012-04-15 MED ORDER — SODIUM CHLORIDE 0.9 % IV SOLN
INTRAVENOUS | Status: DC
  Administered 2012-04-16: 05:00:00 via INTRAVENOUS

## 2012-04-15 MED ORDER — BIOTENE DRY MOUTH MT LIQD
15.0000 mL | Freq: Two times a day (BID) | OROMUCOSAL | Status: DC
  Administered 2012-04-15 – 2012-04-25 (×15): 15 mL via OROMUCOSAL

## 2012-04-15 MED ORDER — CHLORHEXIDINE GLUCONATE 0.12 % MT SOLN
15.0000 mL | Freq: Two times a day (BID) | OROMUCOSAL | Status: DC
  Administered 2012-04-15 – 2012-04-25 (×21): 15 mL via OROMUCOSAL
  Filled 2012-04-15 (×26): qty 15

## 2012-04-15 NOTE — Progress Notes (Signed)
PROGRESS NOTE  Subjective:   Amber Love is an 78 yo admitted after a fall and subsequent left hip fracture.  She was noted to have + Troponin levels and we were consulted for further evaluation.   Echo was being done as I arrived this am.  Preliminary views show moderate LV dysfunction with akineisis of the distal anterior septum and apex. EF ~ 35-40%.    Objective:    Vital Signs:   Temp:  [98.3 F (36.8 C)-99.2 F (37.3 C)] 98.8 F (37.1 C) (12/01 0400) Pulse Rate:  [62-86] 63  (12/01 0600) Resp:  [12-20] 17  (12/01 0600) BP: (98-141)/(53-93) 132/63 mmHg (12/01 0600) SpO2:  [95 %-99 %] 99 % (12/01 0600) Weight:  [147 lb 7.8 oz (66.9 kg)-150 lb (68.04 kg)] 147 lb 7.8 oz (66.9 kg) 04/27/2023 2240)      24-hour weight change: Weight change:   Weight trends: Filed Weights   2012/04/26 1705 04-26-2012 2240  Weight: 150 lb (68.04 kg) 147 lb 7.8 oz (66.9 kg)    Intake/Output:  04-27-2023 0701 - 12/01 0700 In: 160  Out: 400 [Urine:400]     Physical Exam: BP 132/63  Pulse 63  Temp 98.8 F (37.1 C) (Oral)  Resp 17  Ht 5\' 4"  (1.626 m)  Wt 147 lb 7.8 oz (66.9 kg)  BMI 25.32 kg/m2  SpO2 99%  General: Vital signs reviewed and noted.   Head: Normocephalic, atraumatic.  Eyes: conjunctivae/corneas clear.  EOM's intact.   Throat: normal  Neck: Thin,   Lungs:   clear  Heart: RR, distant heart sounds  Abdomen:  Soft, non-tender, non-distended    Extremities: No edema,    Neurologic: Demented   Psych: Demented, did not respond to questions.    Labs: BMET:  Basename 04/15/12 0343 Apr 26, 2012 1718  NA 137 135  K 3.9 3.3*  CL 98 94*  CO2 31 30  GLUCOSE 159* 154*  BUN 17 16  CREATININE 1.04 1.00  CALCIUM 8.7 9.3  MG -- --  PHOS -- --    Liver function tests:  [facility] 04/15/12 0343 04-26-12 1945  AST 20 18  ALT 11 11  ALKPHOS 83 85  BILITOT 0.4 0.5  PROT 6.6 6.7  ALBUMIN 3.0* 3.1*   No results found for this basename: LIPASE:2,AMYLASE:2 in the last 72  hours  CBC:  Basename 04/15/12 0343 04/26/2012 1718  WBC 14.1* 9.9  NEUTROABS -- 7.8*  HGB 10.9* 12.0  HCT 32.6* 35.9*  MCV 93.7 92.1  PLT 221 250    Cardiac Enzymes:  Basename 04/15/12 0343 2012-04-26 1955 04/26/2012 1945  CKTOTAL -- 49 --  CKMB -- 3.0 --  TROPONINI 0.88* -- 0.46*    Coagulation Studies:  Basename 04/15/12 0343 April 26, 2012 1945 2012/04/26 1718  LABPROT 19.8* 24.2* 23.4*  INR 1.75* 2.29* 2.19*    Other: No components found with this basename: POCBNP:3 No results found for this basename: DDIMER in the last 72 hours  Basename 2012-04-26 1718  HGBA1C 6.2*      Tele:  Ventricular pacing  ECG:  Atrial fib:  TWI in V3-V6  Medications:    Infusions:    Scheduled Medications:    . [COMPLETED] sodium chloride   Intravenous Once  . [COMPLETED] sodium chloride   Intravenous Once  . [COMPLETED] aspirin      . aspirin EC  81 mg Oral Daily  . escitalopram  10 mg Oral QPM  . levothyroxine  100 mcg Oral QAC breakfast  . LORazepam  0.5-1 mg Oral BID  . metoprolol  12.5 mg Oral BID  . [COMPLETED] morphine  4 mg Intravenous Once  . [COMPLETED] ondansetron (ZOFRAN) IV  4 mg Intravenous Once  . [COMPLETED] sodium chloride  1,000 mL Intravenous Once  . sodium chloride  3 mL Intravenous Q12H  . [DISCONTINUED] heparin  5,000 Units Subcutaneous Q8H    Assessment/ Plan:    CAD / NSTEMI Pt has TWI in the anterior leads.  The initial views on echo show mid-distal anterior septal and apical akinesis.  I suspect she has significant CAD.  She has significant dementia and cannot / did not tell me about any angina.  The troponin levels are mildly elevated and are likely due to the stress of falling and breaking her hip.  I suspect that she has had an anterior MI in the past.  She has dementia and is not able to tell us any history.  She is not in shock and her HR and BP are normal this am.  I doubt she is having an MI currently.    Given this information, she is at moderate-  high risk for CV complications with her hip repair but I do not think we have any other options but to fix her hip.   Final decision will be up to the Internal Medicine and Ortho services.    Will get the echo read later today but the prelim findings are above.   HYPOTHYROIDISM (08/16/2008)  HYPERTENSION (08/16/2008)  BRADYCARDIA (08/16/2008)  Pacemaker (10/14/2010) Currently pacing   Falls (11/07/2011) She has a hx of falls for the past several months.  She is at risk for significant hemorrhage if we continue the anticoagulation but she is also at high risk for recurrent CVA with her atrial fib.  Displaced intertrochanteric fracture of left femur (04/14/2012) Plans per ortho.   Disposition:  Length of Stay: 1  Vesta Mixer, Montez Hageman., MD, [facility] 04/15/2012, 8:15 AM Office [phone] Pager [phone]

## 2012-04-15 NOTE — Progress Notes (Addendum)
Subjective:     Pleasantly demented 78 year old female she is presently in the step down unit with O2 per nasal cannula and cardiac monitoring. She has elevated troponin levels suggesting possible subendocardial ischemia and is currently n.p.o. considering surgical intervention. Family is concerned about risks involved with surgery. I quoted the family risk of at least 5-10% or risk of mortality associated with surgical intervention. This is high risk considering normal risks to be in the 1 in  200,000 range.I spoke with Dr. Elisabeth Pigeon and for now I am inclined to wait and see if her medical situation improves.  Patient reports pain as moderate.    Objective:   VITALS:  Temp:  [98.1 F (36.7 C)-99.2 F (37.3 C)] 98.1 F (36.7 C) (12/01 1200) Pulse Rate:  [61-86] 72  (12/01 1500) Resp:  [12-20] 17  (12/01 1500) BP: (89-141)/(43-93) 92/43 mmHg (12/01 1500) SpO2:  [94 %-99 %] 95 % (12/01 1500) Weight:  [66.9 kg (147 lb 7.8 oz)-68.04 kg (150 lb)] 66.9 kg (147 lb 7.8 oz) (11/30 2240)  Neurologically intact ABD soft Intact pulses distally Dorsiflexion/Plantar flexion intact   LABS  Basename 04/15/12 0343 04/14/12 1718  HGB 10.9* 12.0  WBC 14.1* 9.9  PLT 221 250    Basename 04/15/12 0343 04/14/12 1718  NA 137 135  K 3.9 3.3*  CL 98 94*  CO2 31 30  BUN 17 16  CREATININE 1.04 1.00  GLUCOSE 159* 154*    Basename 04/15/12 0343 04/14/12 1945  LABPT -- --  INR 1.75* 2.29*     Assessment/Plan:    Left hip high intertrochanteric fracture vs basicervical fracture. Possible subendocardial ischemia with persistant low elevated troponin INR 1.75 on xarelto.  Advance diet Continue to follow and if she makes improvement then reconsider intervention. When her condition improves then bed to recliner and bed to wheelchair is possible  With hoyer lift. Intra articular local injection can also be performed is necessary.  Darah Simkin E 04/15/2012, 4:33 PM

## 2012-04-15 NOTE — Progress Notes (Signed)
[facility] PROGRESS NOTE  Amber Love ZOX:[id number] DOB: [birth year] DOA: 04/14/2012 PCP: Josue Hector, MD  Brief narrative: 78 year old female admitted status post fall and subsequent left hip fracture who was noted to have elevated troponins pre-operatively.   Assessment/Plan:  Principal Problem:  *Displaced intertrochanteric fracture of left femur  Per cardio recommendations the patient is a moderate to high risk for the surgery  I spoke extensively with the patient's daughter and she would prefer to speak with ortho to see what their recommendation is but if there are more risks with surgery than benefits clearly she would want her mother to be kept comfortable with no surgical intervention and possible SNF placement at discharge  Management per ortho  Active Problems:  NSTEMI (non-ST elevated myocardial infarction)  Per cardiology patient has significant CAD and has probably had anterior MI in past and they determined she is a moderate to high risk for cardiovascular complications if she were to undergo surgery for hip fracture  HYPOTHYROIDISM  Continue levothyroxine  HYPERTENSION  Continue metoprolol  PAROXYSMAL ATRIAL FIBRILLATION  Only on aspirin  xarelto on hold  Rate control with metoprolol  Chronic diastolic heart failure  Appreciate cardiology following  Follow up 2 D ECHO  Leukocytosis  Likely reactive, no obvious source of infection  Anemia  Likely of chronic disease  No signs of active bleed  Will continue to monitor CBC   Code Status: DNR Family Communication: updated the patient's daughter over the phone Disposition Plan: likely SNF placement which would be the patient's daughter preference  Manson Passey, MD  [facility] Pager [phone]  If 7PM-7AM, please contact night-coverage [email or web address] Password [email or web address] 04/15/2012, 7:27 AM   LOS: 1 day   Consultants:  Cardiology  Orthopedics    Procedures:  none  Antibiotics:  none   HPI/Subjective: No acute overnight events.  Objective: Filed Vitals:   04/15/12 0100 04/15/12 0200 04/15/12 0400 04/15/12 0600  BP:  98/53 105/54 132/63  Pulse: 71 64 62 63  Temp:   98.8 F (37.1 C)   TempSrc:   Oral   Resp:  13 13 17   Height:      Weight:      SpO2:  96% 97% 99%    Intake/Output Summary (Last 24 hours) at 04/15/12 0727 Last data filed at 04/15/12 0600  Gross per 24 hour  Intake    160 ml  Output    400 ml  Net   -240 ml    Exam:   General:  Pt is in no acute distress  Cardiovascular: paced rhythm  Respiratory: Clear to auscultation bilaterally, no wheezing, no crackles, no rhonchi  Abdomen: Soft, non tender, non distended, bowel sounds present, no guarding  Extremities: No edema, pulses DP and PT palpable bilaterally  Neuro: Grossly nonfocal  Data Reviewed: Basic Metabolic Panel:  Lab 04/15/12 8295 04/14/12 1718  NA 137 135  K 3.9 3.3*  CL 98 94*  CO2 31 30  GLUCOSE 159* 154*  BUN 17 16  CREATININE 1.04 1.00  CALCIUM 8.7 9.3   Liver Function Tests:  Lab 04/15/12 0343 04/14/12 1945  AST 20 18  ALT 11 11  ALKPHOS 83 85  BILITOT 0.4 0.5  PROT 6.6 6.7  ALBUMIN 3.0* 3.1*   CBC:  Lab 04/15/12 0343 04/14/12 1718  WBC 14.1* 9.9  HGB 10.9* 12.0  HCT 32.6* 35.9*  MCV 93.7 92.1  PLT 221 250   Cardiac Enzymes:  Lab 04/15/12 0343 04/14/12 1955 04/14/12 1945  CKTOTAL -- 49 --  CKMB -- 3.0 --  TROPONINI 0.88* -- 0.46*    MRSA PCR SCREENING     Status: Normal   Collection Time   04/14/12  9:06 PM      Component Value Range Status Comment   MRSA by PCR NEGATIVE  NEGATIVE Final      Studies: Dg Chest 1 View 04/14/2012   IMPRESSION: Stable cardiomegaly and moderate changes of chronic bronchitis and/or asthma.  No acute cardiopulmonary disease.   Original Report Authenticated By: Hulan Saas, M.D.    Dg Hip Complete Left 04/14/2012  * IMPRESSION: Comminuted left hip  fracture which appears to be primarily an intertrochanteric fracture.  There may be a subcapital component of fracture as well.   Original Report Authenticated By: Irish Lack, M.D.    Ct Head Wo Contrast 04/14/2012   IMPRESSION: No acute findings.  Stable atrophy and small vessel disease.   Original Report Authenticated By: Irish Lack, M.D.     Scheduled Meds:  . aspirin EC  81 mg Oral Daily  . escitalopram  10 mg Oral QPM  . levothyroxine  100 mcg Oral QAC breakfast  . LORazepam  0.5-1 mg Oral BID  . metoprolol  12.5 mg Oral BID

## 2012-04-15 NOTE — Progress Notes (Signed)
See full echo note from today.  The echo reveals diffuse ( anterior and inferior) apical akinesis with well preserved LV contractility in the base.  This is c/w a Takotsubo cardiomyopathy ( stress induced coronary spasm which leads to transient LV dysfunction, echo abnormalities as described above).  We have seen Takotsubo following traumatic falls previously.  If this is the case, this would have a better prognosis than if she had fixed obstructive CAD.  The diagnosis would typically be confirmed by heart cath but I do not think she is a good candidate for cath currently.  If she has Takotsubo, we could expect some improvement in her LV function.  Vesta Mixer, Montez Hageman., MD, [facility] 04/15/2012, 4:47 PM Office - [phone] Pager [phone]

## 2012-04-15 NOTE — Plan of Care (Signed)
Problem: Phase I Progression Outcomes Goal: Post op clear liquids, advance diet as tolerated Outcome: Progressing Patient  Appetite is very poor.

## 2012-04-15 NOTE — Progress Notes (Signed)
  Echocardiogram 2D Echocardiogram has been performed.  Darren Nodal 04/15/2012, 8:44 AM

## 2012-04-15 NOTE — Progress Notes (Signed)
Crushed patient's medications and placed in 1/2 teaspoon of applesauce.  Patient swished the medication around and though verbally encouraged to swallow, spit the medication out.  Attempted to administer oral pain medication.  Patient spit out part of the medication.  Per family patient spits out medications often.  Notified Dr. Ashok Pall and pain medication changed to IV.  Will continue to monitor.

## 2012-04-16 DIAGNOSIS — I5020 Unspecified systolic (congestive) heart failure: Secondary | ICD-10-CM | POA: Diagnosis present

## 2012-04-16 DIAGNOSIS — D649 Anemia, unspecified: Secondary | ICD-10-CM

## 2012-04-16 LAB — CBC
HCT: 30.7 % — ABNORMAL LOW (ref 36.0–46.0)
Hemoglobin: 9.7 g/dL — ABNORMAL LOW (ref 12.0–15.0)
MCH: 30.4 pg (ref 26.0–34.0)
MCHC: 31.6 g/dL (ref 30.0–36.0)
RDW: 12.6 % (ref 11.5–15.5)

## 2012-04-16 LAB — BASIC METABOLIC PANEL
BUN: 19 mg/dL (ref 6–23)
Calcium: 8.8 mg/dL (ref 8.4–10.5)
Creatinine, Ser: 0.97 mg/dL (ref 0.50–1.10)
GFR calc non Af Amer: 50 mL/min — ABNORMAL LOW (ref >90)
Glucose, Bld: 146 mg/dL — ABNORMAL HIGH (ref 70–99)

## 2012-04-16 MED ORDER — HYDROMORPHONE HCL PF 1 MG/ML IJ SOLN
1.0000 mg | INTRAMUSCULAR | Status: DC | PRN
  Administered 2012-04-16 – 2012-04-19 (×4): 1 mg via INTRAVENOUS
  Filled 2012-04-16 (×4): qty 1

## 2012-04-16 MED ORDER — LISINOPRIL 2.5 MG PO TABS
2.5000 mg | ORAL_TABLET | Freq: Two times a day (BID) | ORAL | Status: DC
  Administered 2012-04-16 – 2012-04-18 (×4): 2.5 mg via ORAL
  Filled 2012-04-16 (×6): qty 1

## 2012-04-16 MED ORDER — SODIUM CHLORIDE 0.9 % IV SOLN: INTRAVENOUS | Status: AC

## 2012-04-16 MED ORDER — SODIUM CHLORIDE 0.9 % IV SOLN: INTRAVENOUS | Status: DC

## 2012-04-16 NOTE — Progress Notes (Addendum)
PROGRESS NOTE  Subjective:   Ms Nehls is an 78 yo admitted after a fall and subsequent left hip fracture.  She was noted to have + Troponin levels and we were consulted for further evaluation.   The echo reveals apical akinesis with EF of 40% - the contractility pattern is c/w Takotsubo cardiomyopathy.   She is pleasantly demented.  Did not know this am that she had broken her hip.  Wants to go home.  Objective:    Vital Signs:   Temp:  [97.1 F (36.2 C)-100.1 F (37.8 C)] 99.6 F (37.6 C) (12/02 0433) Pulse Rate:  [62-89] 78  (12/02 0400) Resp:  [14-21] 14  (12/02 0400) BP: (90-118)/(40-74) 118/72 mmHg (12/02 0400) SpO2:  [93 %-99 %] 97 % (12/02 0400)      24-hour weight change: Weight change:   Weight trends: Filed Weights   2012-05-09 1705 May 09, 2012 2240  Weight: 150 lb (68.04 kg) 147 lb 7.8 oz (66.9 kg)    Intake/Output:  12/01 0701 - 12/02 0700 In: 770 [I.V.:770] Out: 370 [Urine:370]     Physical Exam: BP 118/72  Pulse 78  Temp 99.6 F (37.6 C) (Axillary)  Resp 14  Ht 5\' 4"  (1.626 m)  Wt 147 lb 7.8 oz (66.9 kg)  BMI 25.32 kg/m2  SpO2 97%  General: Vital signs reviewed and noted.  Patient feels warm.  Head: Normocephalic, atraumatic.  Eyes: conjunctivae/corneas clear.  EOM's intact.   Throat: normal  Neck: Thin,   Lungs:  clear  Heart: RR, distant heart sounds, soft systolic murmur  Abdomen:  Soft, non-tender, non-distended    Extremities: No edema,    Neurologic: Demented   Psych: Demented, did not know why she is here    Labs: BMET:  Basename 04/16/12 0353 04/15/12 0343  NA 139 137  K 3.7 3.9  CL 101 98  CO2 31 31  GLUCOSE 146* 159*  BUN 19 17  CREATININE 0.97 1.04  CALCIUM 8.8 8.7  MG -- --  PHOS -- --    Liver function tests:  [facility] 04/15/12 0343 2012/05/09 1945  AST 20 18  ALT 11 11  ALKPHOS 83 85  BILITOT 0.4 0.5  PROT 6.6 6.7  ALBUMIN 3.0* 3.1*   No results found for this basename: LIPASE:2,AMYLASE:2 in the last 72  hours  CBC:  Basename 04/16/12 0353 04/15/12 0343 05/09/12 1718  WBC 13.8* 14.1* --  NEUTROABS -- -- 7.8*  HGB 9.7* 10.9* --  HCT 30.7* 32.6* --  MCV 96.2 93.7 --  PLT 162 221 --    Cardiac Enzymes:  Basename 04/15/12 0904 04/15/12 0343 09-May-2012 1955 2012-05-09 1945  CKTOTAL -- -- 49 --  CKMB -- -- 3.0 --  TROPONINI 0.60* 0.88* -- 0.46*    Coagulation Studies:  Basename 04/15/12 0343 09-May-2012 1945 May 09, 2012 1718  LABPROT 19.8* 24.2* 23.4*  INR 1.75* 2.29* 2.19*    Other: No components found with this basename: POCBNP:3 No results found for this basename: DDIMER in the last 72 hours  Basename 05-09-12 1718  HGBA1C 6.2*      Tele:  A-fib  Medications:    Infusions:    . sodium chloride 50 mL/hr at 04/16/12 0445    Scheduled Medications:    . antiseptic oral rinse  15 mL Mouth Rinse q12n4p  . aspirin EC  81 mg Oral Daily  . chlorhexidine  15 mL Mouth Rinse BID  . escitalopram  10 mg Oral QPM  . levothyroxine  100 mcg  Oral QAC breakfast  . metoprolol  12.5 mg Oral BID  . sodium chloride  3 mL Intravenous Q12H  . [DISCONTINUED] LORazepam  0.5-1 mg Oral BID    Assessment/ Plan:    1. CAD / NSTEMI  The echo shows apical akinesis with moderate LV dysfunction.  The contractility pattern is c/w Takotsubo cardiomyopathy.  Another possibility is that she has had a previous apical MI.  Her Troponin levels are not very elevated so I do not think that she has had a recent apical MI  She has dementia and is not able to tell us any history.  She is not in shock and her HR and BP are normal this am.  I doubt she is having an MI currently.     Given this information, she is at moderate- high risk for CV complications with her hip repair but I do not think we have any other options but to fix her hip.   Final decision will be up to the Internal Medicine and Ortho services.      Continue metoprolol 12.5 BID Add Lisinopril 2.5 BID - this will help her LV function  improve.  2. Systolic congestive heart failure:  I do not know at this point whether the LV dysfunction is acute or chronic.  It it improves, we will assume this was a Takotsubo cardiomyopathy caused by coronary spasm ( and therefore this would be acute systolic CHF).  If it does not improve, we would be able to say that this is chronic systolic CHF.  Continue metoprolol 12.5 BID Add Lisinopril 2.5 BID - this will help her LV function improve.  Pacemaker (10/14/2010) Currently pacing   Falls (11/07/2011) She has a hx of falls for the past several months.  She is at risk for significant hemorrhage if we continue the anticoagulation but she is also at high risk for recurrent CVA with her atrial fib.  Displaced intertrochanteric fracture of left femur (04/14/2012) Plans per ortho.  Disposition:  Length of Stay: 2  Vesta Mixer, Montez Hageman., MD, [facility] 04/16/2012, 8:07 AM Office [phone] Pager [phone]

## 2012-04-16 NOTE — Progress Notes (Signed)
   CARE MANAGEMENT NOTE 04/16/2012  Patient:  Amber Love, Amber Love   Account Number:  [id number]  Date Initiated:  04/16/2012  Documentation initiated by:  DAVIS,RHONDA  Subjective/Objective Assessment:   pt with fall and left hip fracture, also sustained nstemi     Action/Plan:   home   Anticipated DC Date:  04/19/2012   Anticipated DC Plan:  SKILLED NURSING FACILITY  In-house referral  Clinical Social Worker      DC Planning Services  NA      [facility] Choice  NA   Choice offered to / List presented to:  NA   DME arranged  NA      DME agency  NA     HH arranged  NA      HH agency  NA   Status of service:  In process, will continue to follow Medicare Important Message given?  NA - LOS <3 / Initial given by admissions (If response is "NO", the following Medicare IM given date fields will be blank) Date Medicare IM given:   Date Additional Medicare IM given:    Discharge Disposition:    Per UR Regulation:  Reviewed for med. necessity/level of care/duration of stay  If discussed at Long Length of Stay Meetings, dates discussed:    Comments:  [id number]/Rhonda Earlene Plater, RN, BSN, CCM: CHART REVIEWED AND UPDATED.  Next chart review due on [id number]. NO DISCHARGE NEEDS PRESENT AT THIS TIME. CASE MANAGEMENT [phone]

## 2012-04-16 NOTE — Evaluation (Signed)
SLP Cancellation Note  Patient Details Name: Amber Love MRN: [id number] DOB: [birth year]   Cancelled treatment:       Reason Eval/Treat Not Completed: Fatigue/lethargy limiting ability to participate (pt given dilaudid per RN, not alert enough for eval or po).  Pt possibly for surgery tomorrow and tolerating small amounts of water per RN.  SLP to return next date if pt does not have surgery.  RN paging MD currently to see if full liquid diet can be reinstated.  Donavan Burnet, MS [facility] SLP [phone]    04/16/2012, 3:11 PM

## 2012-04-16 NOTE — Progress Notes (Signed)
[facility] PROGRESS NOTE  HARPREET SIGNORE RUE:[id number] DOB: 04-19-1921 DOA: 04/14/2012 PCP: Josue Hector, MD  Brief narrative: 78 year old female admitted status post fall and subsequent left hip fracture who was noted to have elevated troponins pre-operatively.   Assessment/Plan:   Principal Problem:  *Displaced intertrochanteric fracture of left femur  Per cardio recommendations the patient is a moderate to high risk for the surgery  For now per orthopedics we'll hold off on surgery Patient's daughter reported morphine is not helping with pain relief so we will switch to Dilaudid 1 mg every 4 hours as needed for severe pain  Active Problems:  NSTEMI (non-ST elevated myocardial infarction)  Per cardiology patient has significant CAD and has probably had anterior MI in past and they determined she is a moderate to high risk for cardiovascular complications if she were to undergo surgery for hip fracture HYPOTHYROIDISM  Continue levothyroxine HYPERTENSION  Continue metoprolol PAROXYSMAL ATRIAL FIBRILLATION  Only on aspirin  xarelto on hold  Rate control with metoprolol Chronic diastolic heart failure  Appreciate cardiology following  Follow up 2 D ECHO Leukocytosis  Likely reactive, no obvious source of infection Anemia  Likely of chronic disease  No signs of active bleed  Will continue to monitor CBC  Code Status: DNR  Family Communication: updated the patient's daughter at the bedside  Disposition Plan: likely SNF placement   Manson Passey, MD  [facility]  Pager [phone]    Consultants:  Cardiology  Orthopedics  Procedures:  none Antibiotics:  none   If 7PM-7AM, please contact night-coverage [email or web address] Password [email or web address] 04/16/2012, 7:02 AM   LOS: 2 days   HPI/Subjective: No acute events overnight.  Objective: Filed Vitals:   04/16/12 0015 04/16/12 0400 04/16/12 0405 04/16/12 0433  BP:  118/72    Pulse:  78    Temp: 99.8 F (37.7 C)   100.1 F (37.8 C) 99.6 F (37.6 C)  TempSrc: Axillary  Axillary Axillary  Resp:  14    Height:      Weight:      SpO2:  97%      Intake/Output Summary (Last 24 hours) at 04/16/12 6213 Last data filed at 04/16/12 0400  Gross per 24 hour  Intake    670 ml  Output    310 ml  Net    360 ml    Exam:   General:  Pt is alert, follows commands appropriately, not in acute distress  Cardiovascular: Paced rhythm  Respiratory: Clear to auscultation bilaterally, no wheezing, no crackles, no rhonchi  Abdomen: Soft, non tender, non distended, bowel sounds present, no guarding  Extremities: No edema, pulses DP and PT palpable bilaterally  Neuro: Grossly nonfocal  Data Reviewed: Basic Metabolic Panel:  Lab 04/16/12 0865 04/15/12 0343 04/14/12 1718  NA 139 137 135  K 3.7 3.9 3.3*  CL 101 98 94*  CO2 31 31 30   GLUCOSE 146* 159* 154*  BUN 19 17 16   CREATININE 0.97 1.04 1.00  CALCIUM 8.8 8.7 9.3   Liver Function Tests:  Lab 04/15/12 0343 04/14/12 1945  AST 20 18  ALT 11 11  ALKPHOS 83 85  BILITOT 0.4 0.5  PROT 6.6 6.7  ALBUMIN 3.0* 3.1*   CBC:  Lab 04/16/12 0353 04/15/12 0343 04/14/12 1718  WBC 13.8* 14.1* 9.9  HGB 9.7* 10.9* 12.0  HCT 30.7* 32.6* 35.9*  MCV 96.2 93.7 92.1  PLT 162 221 250   Cardiac Enzymes:  Lab 04/15/12 0904 04/15/12 0343 04/14/12 1955 04/14/12  1945  CKTOTAL -- -- 49 --  CKMB -- -- 3.0 --  CKMBINDEX -- -- -- --  TROPONINI 0.60* 0.88* -- 0.46*   CBG:  Lab 04/15/12 1646 04/15/12 1212 04/15/12 0804  GLUCAP 135* 130* 149*    Recent Results (from the past 240 hour(s))  MRSA PCR SCREENING     Status: Normal   Collection Time   04/14/12  9:06 PM      Component Value Range Status Comment   MRSA by PCR NEGATIVE  NEGATIVE Final      Studies: Dg Chest 1 View  04/14/2012  *RADIOLOGY REPORT*  Clinical Data: Comminuted left femoral neck fracture.  Preoperative respiratory evaluation.  CHEST - 1 VIEW  Comparison: Portable chest x-ray  07/31/2011, 07/02/2011 and CTA chest 07/02/2011 [facility].  Findings: Cardiac silhouette markedly enlarged but stable. Thoracic aorta atherosclerotic, unchanged.  Hilar and mediastinal contours otherwise unremarkable.  Prominent bronchovascular markings diffusely and central peribronchial thickening, unchanged. Pulmonary vascularity normal without evidence of pulmonary edema. No new pulmonary parenchymal abnormalities.  IMPRESSION: Stable cardiomegaly and moderate changes of chronic bronchitis and/or asthma.  No acute cardiopulmonary disease.   Original Report Authenticated By: Hulan Saas, M.D.    Dg Hip Complete Left  04/14/2012  *RADIOLOGY REPORT*  Clinical Data: Fall with left hip pain.  LEFT HIP - COMPLETE 2+ VIEW  Comparison: None.  Findings: Acute, comminuted and angulated fracture of the proximal left femur is present with primarily intertrochanteric fracture planes.  There also may be a subcapital component of injury.  No dislocation.  The bony pelvis is intact.  IMPRESSION: Comminuted left hip fracture which appears to be primarily an intertrochanteric fracture.  There may be a subcapital component of fracture as well.   Original Report Authenticated By: Irish Lack, M.D.    Ct Head Wo Contrast  04/14/2012  *RADIOLOGY REPORT*  Clinical Data: Fall and acute mental status changes.  CT HEAD WITHOUT CONTRAST  Technique:  Contiguous axial images were obtained from the base of the skull through the vertex without contrast.  Comparison: Prior head CT on 07/31/2011 at [facility].  Findings: Advanced atrophy and small vessel ischemic changes are stable since the prior study.  The brain demonstrates no evidence of hemorrhage, infarction, edema, mass effect, extra-axial fluid collection, hydrocephalus or mass lesion.  The skull is unremarkable.  IMPRESSION: No acute findings.  Stable atrophy and small vessel disease.   Original Report Authenticated By: Irish Lack,  M.D.     Scheduled Meds:   . antiseptic oral rinse  15 mL Mouth Rinse q12n4p  . aspirin EC  81 mg Oral Daily  . chlorhexidine  15 mL Mouth Rinse BID  . escitalopram  10 mg Oral QPM  . levothyroxine  100 mcg Oral QAC breakfast  . metoprolol  12.5 mg Oral BID  . sodium chloride  3 mL Intravenous Q12H   Continuous Infusions:   . sodium chloride 50 mL/hr at 04/16/12 0445

## 2012-04-16 NOTE — Progress Notes (Signed)
Subjective:    That hurts. Awake, alert, Ox2. Troponin levels have returned to normal.Recieving Morphine IV for pain.  Patient reports pain as marked.    Objective:   VITALS:  Temp:  [97.1 F (36.2 C)-100.1 F (37.8 C)] 99.7 F (37.6 C) (12/02 1200) Pulse Rate:  [66-89] 84  (12/02 0800) Resp:  [13-21] 13  (12/02 0800) BP: (90-119)/(40-74) 119/47 mmHg (12/02 1006) SpO2:  [93 %-99 %] 97 % (12/02 0800)  Neurologically intact ABD soft Sensation intact distally Dorsiflexion/Plantar flexion intact Compartment soft   LABS  Basename 04/16/12 0353 04/15/12 0343 04/14/12 1718  HGB 9.7* 10.9* 12.0  WBC 13.8* 14.1* --  PLT 162 221 --    Basename 04/16/12 0353 04/15/12 0343  NA 139 137  K 3.7 3.9  CL 101 98  CO2 31 31  BUN 19 17  CREATININE 0.97 1.04  GLUCOSE 146* 159*    Basename 04/15/12 0343 04/14/12 1945  LABPT -- --  INR 1.75* 2.29*     Assessment/Plan:    Left intertrochanteric Hip Fracture high level vs low cervical hip fracture. Cardiac disease, troponin is normalizing. Her level of pain is significant. Still great risk if surgery.  Continue foley due to patient critically ill and patient in ICU   NITKA,JAMES E 04/16/2012, 1:51 PM

## 2012-04-17 ENCOUNTER — Inpatient Hospital Stay (HOSPITAL_COMMUNITY): Payer: Medicare Other

## 2012-04-17 ENCOUNTER — Encounter (HOSPITAL_COMMUNITY): Payer: Self-pay | Admitting: Anesthesiology

## 2012-04-17 ENCOUNTER — Encounter (HOSPITAL_COMMUNITY)
Admission: EM | Disposition: A | Discharge status: Home Health | Payer: Self-pay | Source: Home / Self Care | Attending: Internal Medicine

## 2012-04-17 ENCOUNTER — Inpatient Hospital Stay (HOSPITAL_COMMUNITY): Payer: Medicare Other | Admitting: Anesthesiology

## 2012-04-17 ENCOUNTER — Encounter (HOSPITAL_COMMUNITY): Payer: Self-pay | Admitting: Specialist

## 2012-04-17 DIAGNOSIS — I5032 Chronic diastolic (congestive) heart failure: Secondary | ICD-10-CM

## 2012-04-17 HISTORY — PX: FEMUR IM NAIL: SHX1597

## 2012-04-17 LAB — CBC
HCT: 29.9 % — ABNORMAL LOW (ref 36.0–46.0)
Hemoglobin: 9.5 g/dL — ABNORMAL LOW (ref 12.0–15.0)
Hemoglobin: 9.9 g/dL — ABNORMAL LOW (ref 12.0–15.0)
MCH: 30.6 pg (ref 26.0–34.0)
MCH: 30.5 pg (ref 26.0–34.0)
MCHC: 31.8 g/dL (ref 30.0–36.0)
MCV: 96.5 fL (ref 78.0–100.0)
MCV: 98.8 fL (ref 78.0–100.0)
RBC: 3.25 MIL/uL — ABNORMAL LOW (ref 3.87–5.11)
WBC: 22.4 10*3/uL — ABNORMAL HIGH (ref 4.0–10.5)

## 2012-04-17 LAB — BASIC METABOLIC PANEL
BUN: 20 mg/dL (ref 6–23)
CO2: 24 mEq/L (ref 19–32)
Calcium: 8.4 mg/dL (ref 8.4–10.5)
Calcium: 8.6 mg/dL (ref 8.4–10.5)
Chloride: 103 mEq/L (ref 96–112)
Creatinine, Ser: 0.77 mg/dL (ref 0.50–1.10)
GFR calc non Af Amer: 72 mL/min — ABNORMAL LOW (ref >90)
Glucose, Bld: 144 mg/dL — ABNORMAL HIGH (ref 70–99)
Glucose, Bld: 219 mg/dL — ABNORMAL HIGH (ref 70–99)
Potassium: 3.5 mEq/L (ref 3.5–5.1)
Sodium: 142 mEq/L (ref 135–145)

## 2012-04-17 LAB — GLUCOSE, CAPILLARY

## 2012-04-17 LAB — BLOOD GAS, ARTERIAL
Acid-base deficit: 3.2 mmol/L — ABNORMAL HIGH (ref 0.0–2.0)
Bicarbonate: 21.4 mEq/L (ref 20.0–24.0)
O2 Saturation: 94 %
pO2, Arterial: 71.2 mmHg — ABNORMAL LOW (ref 80.0–100.0)

## 2012-04-17 LAB — PROTIME-INR
INR: 1.29 (ref 0.00–1.49)
Prothrombin Time: 15.8 seconds — ABNORMAL HIGH (ref 11.6–15.2)

## 2012-04-17 SURGERY — INSERTION, INTRAMEDULLARY ROD, FEMUR
Anesthesia: Spinal | Site: Hip | Laterality: Left | Wound class: Clean

## 2012-04-17 MED ORDER — ACETAMINOPHEN 325 MG PO TABS
650.0000 mg | ORAL_TABLET | Freq: Four times a day (QID) | ORAL | Status: DC | PRN
  Administered 2012-04-25: 650 mg via ORAL
  Filled 2012-04-17: qty 2

## 2012-04-17 MED ORDER — METOCLOPRAMIDE HCL 5 MG/ML IJ SOLN: 5.0000 mg | Freq: Three times a day (TID) | INTRAMUSCULAR | Status: DC | PRN

## 2012-04-17 MED ORDER — CEFAZOLIN SODIUM-DEXTROSE 2-3 GM-% IV SOLR
2.0000 g | INTRAVENOUS | Status: AC
  Administered 2012-04-17: 2 g via INTRAVENOUS
  Filled 2012-04-17: qty 50

## 2012-04-17 MED ORDER — BISACODYL 5 MG PO TBEC: 5.0000 mg | DELAYED_RELEASE_TABLET | Freq: Every day | ORAL | Status: DC | PRN

## 2012-04-17 MED ORDER — HYDROMORPHONE HCL PF 1 MG/ML IJ SOLN: 0.2500 mg | INTRAMUSCULAR | Status: DC | PRN

## 2012-04-17 MED ORDER — POLYETHYLENE GLYCOL 3350 17 G PO PACK
17.0000 g | PACK | Freq: Every day | ORAL | Status: DC | PRN
  Filled 2012-04-17: qty 1

## 2012-04-17 MED ORDER — RIVAROXABAN 20 MG PO TABS: 20.0000 mg | ORAL_TABLET | Freq: Every morning | ORAL | Status: DC

## 2012-04-17 MED ORDER — PROPOFOL 10 MG/ML IV BOLUS
INTRAVENOUS | Status: DC | PRN
  Administered 2012-04-17: 25 mg via INTRAVENOUS
  Administered 2012-04-17: 10 mg via INTRAVENOUS
  Administered 2012-04-17: 15 mg via INTRAVENOUS

## 2012-04-17 MED ORDER — MENTHOL 3 MG MT LOZG
1.0000 | LOZENGE | OROMUCOSAL | Status: DC | PRN
  Filled 2012-04-17: qty 9

## 2012-04-17 MED ORDER — MIDAZOLAM HCL 5 MG/5ML IJ SOLN
INTRAMUSCULAR | Status: DC | PRN
  Administered 2012-04-17: 2 mg via INTRAVENOUS

## 2012-04-17 MED ORDER — FENTANYL CITRATE 0.05 MG/ML IJ SOLN
INTRAMUSCULAR | Status: DC | PRN
  Administered 2012-04-17: 50 ug via INTRAVENOUS

## 2012-04-17 MED ORDER — PHENOL 1.4 % MT LIQD
1.0000 | OROMUCOSAL | Status: DC | PRN
  Filled 2012-04-17: qty 177

## 2012-04-17 MED ORDER — ONDANSETRON HCL 4 MG/2ML IJ SOLN: 4.0000 mg | Freq: Four times a day (QID) | INTRAMUSCULAR | Status: DC | PRN

## 2012-04-17 MED ORDER — LACTATED RINGERS IV SOLN: INTRAVENOUS | Status: DC

## 2012-04-17 MED ORDER — ONDANSETRON HCL 4 MG PO TABS: 4.0000 mg | ORAL_TABLET | Freq: Four times a day (QID) | ORAL | Status: DC | PRN

## 2012-04-17 MED ORDER — PROMETHAZINE HCL 25 MG/ML IJ SOLN: 6.2500 mg | INTRAMUSCULAR | Status: DC | PRN

## 2012-04-17 MED ORDER — PHENYLEPHRINE HCL 10 MG/ML IJ SOLN: 30.0000 ug/min | INTRAVENOUS | Status: DC

## 2012-04-17 MED ORDER — ACETAMINOPHEN 650 MG RE SUPP: 650.0000 mg | Freq: Four times a day (QID) | RECTAL | Status: DC | PRN

## 2012-04-17 MED ORDER — BUPIVACAINE HCL (PF) 0.5 % IJ SOLN
INTRAMUSCULAR | Status: AC
  Filled 2012-04-17: qty 30

## 2012-04-17 MED ORDER — MEPERIDINE HCL 50 MG/ML IJ SOLN: 6.2500 mg | INTRAMUSCULAR | Status: DC | PRN

## 2012-04-17 MED ORDER — BUPIVACAINE HCL (PF) 0.5 % IJ SOLN
INTRAMUSCULAR | Status: DC | PRN
  Administered 2012-04-17: 3 mL

## 2012-04-17 MED ORDER — PHENYLEPHRINE HCL 10 MG/ML IJ SOLN
10.0000 mg | INTRAVENOUS | Status: DC | PRN
  Administered 2012-04-17: 10 ug/min via INTRAVENOUS

## 2012-04-17 MED ORDER — METOCLOPRAMIDE HCL 10 MG PO TABS: 5.0000 mg | ORAL_TABLET | Freq: Three times a day (TID) | ORAL | Status: DC | PRN

## 2012-04-17 MED ORDER — DOCUSATE SODIUM 100 MG PO CAPS
100.0000 mg | ORAL_CAPSULE | Freq: Two times a day (BID) | ORAL | Status: DC
  Administered 2012-04-18 – 2012-04-25 (×12): 100 mg via ORAL
  Filled 2012-04-17 (×12): qty 1

## 2012-04-17 MED ORDER — ALUM & MAG HYDROXIDE-SIMETH 200-200-20 MG/5ML PO SUSP: 30.0000 mL | ORAL | Status: DC | PRN

## 2012-04-17 MED ORDER — PHENYLEPHRINE HCL 10 MG/ML IJ SOLN: 10.0000 mg | INTRAVENOUS | Status: DC | PRN

## 2012-04-17 MED ORDER — FLEET ENEMA 7-19 GM/118ML RE ENEM: 1.0000 | ENEMA | Freq: Once | RECTAL | Status: AC | PRN

## 2012-04-17 MED ORDER — CEFAZOLIN SODIUM-DEXTROSE 2-3 GM-% IV SOLR
INTRAVENOUS | Status: AC
  Filled 2012-04-17: qty 50

## 2012-04-17 MED ORDER — CEFAZOLIN SODIUM-DEXTROSE 2-3 GM-% IV SOLR
2.0000 g | Freq: Four times a day (QID) | INTRAVENOUS | Status: AC
  Administered 2012-04-18 (×2): 2 g via INTRAVENOUS
  Filled 2012-04-17 (×2): qty 50

## 2012-04-17 MED ORDER — LACTATED RINGERS IV SOLN
INTRAVENOUS | Status: DC | PRN
  Administered 2012-04-17: 17:00:00 via INTRAVENOUS

## 2012-04-17 SURGICAL SUPPLY — 46 items
BAG SPEC THK2 15X12 ZIP CLS (MISCELLANEOUS) ×1
BAG ZIPLOCK 12X15 (MISCELLANEOUS) ×2 IMPLANT
BANDAGE ELASTIC 6 VELCRO ST LF (GAUZE/BANDAGES/DRESSINGS) ×2 IMPLANT
BANDAGE GAUZE ELAST BULKY 4 IN (GAUZE/BANDAGES/DRESSINGS) ×2 IMPLANT
BIT DRILL 4.3MMS DISTAL GRDTED (BIT) IMPLANT
BLADE SURG 15 STRL LF DISP TIS (BLADE) ×1 IMPLANT
BLADE SURG 15 STRL SS (BLADE) ×2
CLOTH BEACON ORANGE TIMEOUT ST (SAFETY) ×2 IMPLANT
DRAPE STERI IOBAN 125X83 (DRAPES) ×2 IMPLANT
DRILL 4.3MMS DISTAL GRADUATED (BIT) ×2
DRSG MEPILEX BORDER 4X4 (GAUZE/BANDAGES/DRESSINGS) ×3 IMPLANT
DRSG PAD ABDOMINAL 8X10 ST (GAUZE/BANDAGES/DRESSINGS) ×2 IMPLANT
DRSG TEGADERM 4X4.75 (GAUZE/BANDAGES/DRESSINGS) ×2 IMPLANT
DURAPREP 26ML APPLICATOR (WOUND CARE) ×2 IMPLANT
ELECT REM PT RETURN 9FT ADLT (ELECTROSURGICAL) ×2
ELECTRODE REM PT RTRN 9FT ADLT (ELECTROSURGICAL) ×1 IMPLANT
GAUZE XEROFORM 1X8 LF (GAUZE/BANDAGES/DRESSINGS) ×2 IMPLANT
GLOVE ECLIPSE 8.5 STRL (GLOVE) ×4 IMPLANT
GLOVE INDICATOR 6.5 STRL GRN (GLOVE) ×1 IMPLANT
GLOVE SURG SS PI 6.5 STRL IVOR (GLOVE) ×1 IMPLANT
GOWN PREVENTION PLUS LG XLONG (DISPOSABLE) ×1 IMPLANT
GOWN STRL REIN XL XLG (GOWN DISPOSABLE) ×2 IMPLANT
GUIDEPIN 3.2X17.5 THRD DISP (PIN) ×2 IMPLANT
GUIDEWIRE BALL NOSE 80CM (WIRE) ×2 IMPLANT
HFN LH 130 DEG 11MM X 340MM (Nail) ×1 IMPLANT
KIT BASIN OR (CUSTOM PROCEDURE TRAY) ×2 IMPLANT
MANIFOLD NEPTUNE II (INSTRUMENTS) ×2 IMPLANT
PACK GENERAL/GYN (CUSTOM PROCEDURE TRAY) ×2 IMPLANT
PADDING CAST COTTON 6X4 STRL (CAST SUPPLIES) ×2 IMPLANT
POSITIONER SURGICAL ARM (MISCELLANEOUS) ×2 IMPLANT
SCREW ANTI-ROTATION 75MM (Screw) ×1 IMPLANT
SCREW BONE CORTICAL 5.0X38 (Screw) ×1 IMPLANT
SCREW BONE CORTICAL 5.0X42 (Screw) ×1 IMPLANT
SCREW DRILL BIT ANIT ROTATION (BIT) ×1 IMPLANT
SCREW LAG HIP NAIL 10.5X95 (Screw) ×1 IMPLANT
SPONGE GAUZE 4X4 12PLY (GAUZE/BANDAGES/DRESSINGS) ×2 IMPLANT
SPONGE LAP 4X18 X RAY DECT (DISPOSABLE) ×2 IMPLANT
STAPLER VISISTAT 35W (STAPLE) ×1 IMPLANT
SUT ETHILON 3 0 PS 1 (SUTURE) ×2 IMPLANT
SUT VIC AB 0 CT1 27 (SUTURE) ×4
SUT VIC AB 0 CT1 27XBRD ANTBC (SUTURE) ×2 IMPLANT
SUT VIC AB 2-0 CT1 27 (SUTURE) ×4
SUT VIC AB 2-0 CT1 TAPERPNT 27 (SUTURE) ×2 IMPLANT
SUT VICRYL 0 UR6 27IN ABS (SUTURE) ×1 IMPLANT
TOWEL OR 17X26 10 PK STRL BLUE (TOWEL DISPOSABLE) ×4 IMPLANT
WATER STERILE IRR 1000ML POUR (IV SOLUTION) ×2 IMPLANT

## 2012-04-17 NOTE — Anesthesia Procedure Notes (Signed)
Spinal Patient location during procedure: OR Staffing Anesthesiologist: Akiera Allbaugh Performed by: anesthesiologist  Preanesthetic Checklist Completed: patient identified, site marked, surgical consent, pre-op evaluation, timeout performed, IV checked, risks and benefits discussed and monitors and equipment checked Spinal Block Patient position: sitting Prep: Betadine Patient monitoring: heart rate, continuous pulse ox and blood pressure Approach: left paramedian Location: L2-3 Injection technique: single-shot Needle Needle type: Spinocan  Needle gauge: 22 G Needle length: 9 cm Additional Notes Expiration date of kit checked and confirmed. Patient tolerated procedure well, without complications.     

## 2012-04-17 NOTE — Anesthesia Preprocedure Evaluation (Addendum)
Anesthesia Evaluation  Patient identified by MRN, date of birth, ID band Patient awake and Patient confused    Reviewed: Allergy & Precautions, H&P , NPO status , Patient's Chart, lab work & pertinent test results  Airway Mallampati: II TM Distance: >3 FB Neck ROM: Full    Dental No notable dental hx.    Pulmonary neg pulmonary ROS,  breath sounds clear to auscultation  Pulmonary exam normal       Cardiovascular hypertension, Pt. on medications + Past MI negative cardio ROS  + dysrhythmias Atrial Fibrillation + pacemaker Rhythm:Regular Rate:Normal     Neuro/Psych dementia CVA negative neurological ROS  negative psych ROS   GI/Hepatic negative GI ROS, Neg liver ROS,   Endo/Other  negative endocrine ROSHypothyroidism   Renal/GU negative Renal ROS  negative genitourinary   Musculoskeletal negative musculoskeletal ROS (+)   Abdominal   Peds negative pediatric ROS (+)  Hematology negative hematology ROS (+)   Anesthesia Other Findings   Reproductive/Obstetrics negative OB ROS                          Anesthesia Physical Anesthesia Plan  ASA: III  Anesthesia Plan: Spinal   Post-op Pain Management:    Induction:   Airway Management Planned: Simple Face Mask  Additional Equipment:   Intra-op Plan:   Post-operative Plan:   Informed Consent: I have reviewed the patients History and Physical, chart, labs and discussed the procedure including the risks, benefits and alternatives for the proposed anesthesia with the patient or authorized representative who has indicated his/her understanding and acceptance.   Dental advisory given  Plan Discussed with: CRNA and Surgeon  Anesthesia Plan Comments:         Anesthesia Quick Evaluation

## 2012-04-17 NOTE — Progress Notes (Addendum)
[facility] PROGRESS NOTE  BRANDALYN HARTING ZOX:[id number] DOB: [birth year] DOA: 04/14/2012 PCP: Josue Hector, MD  Brief narrative: 78 year old female admitted status post fall and subsequent left hip fracture who was noted to have elevated troponins pre-operatively. Cardiology was consulted and their recommendation was that the patient is a moderate to high risk for this surgery considering the significant coronary artery disease. Initial plan was to wait and see if patient improves with conservative management with pain medications. Patient remains to be in pain even with Dilaudid (initially she was on morphine which did not provide symptomatic relief so we changed to Dilaudid). In my conversations with Dr. Otelia Sergeant of orthopedics we did agree that the patient may benefit from surgery which may be done even tonight provided family still agrees to surgical intervention.  Assessment/Plan:   Principal Problem:  *Displaced intertrochanteric fracture of left femur  Per cardio recommendations the patient is a moderate to high risk for the surgery  Per ortho, we will proceed with the surgery tonight Continue Dilaudid 1 mg every 4 hours as needed IV for severe pain Continue normal saline at 50 cc/ hour Keep n.p.o.  Active Problems:  NSTEMI (non-ST elevated myocardial infarction)  Per cardiology patient has significant CAD and has probably had anterior MI in past and they determined she is a moderate to high risk for cardiovascular complications if she were to undergo surgery for hip fracture 2-D echo was done 04/15/2012 and showed ejection fraction 40-45% Appreciate cardiology following Per cardiology recommendations we will continue aspirin, lisinopril and metoprolol The highest troponin of 0.88 but has trended down to 0.6; no further cardiac enzymes obtained HYPOTHYROIDISM  Continue levothyroxine 100 mg daily HYPERTENSION  Blood pressure at goal, 116/48 Continue metoprolol 12.5 mg  twice daily Continue lisinopril 2.5 mg twice daily PAROXYSMAL ATRIAL FIBRILLATION  Only on aspirin 81 mg daily Xarelto on hold  Rate control with metoprolol Acute on Chronic systolic heart failure  Based on 2-D echo done 04/15/2012 with ejection fraction 40-45% Continue management per cardiology with metoprolol and lisinopril Continue aspirin Leukocytosis  Likely reactive, stress demargination No obvious source of infection that she may be put on antibiotics preoperatively per ortho White blood cell count is trending down Patient is afebrile Anemia  Likely of chronic disease  No signs of active bleed  Hemoglobin remains stable in range 9.5-9.7 Moderate protein calorie malnutrition  Secondary to poor oral intake secondary to dementia  We will obtain SLP evaluation DVT prophylaxis  SCDs bilaterally  Code Status: DNR  Family Communication: updated the patient's grandaughter at the bedside  Disposition Plan: likely SNF placement   Manson Passey, MD  [facility]  Pager [phone]   Consultants:  Cardiology  Orthopedics  Other consultants:  SLP evaluation  Physical therapy  Social work for skilled nursing facility placement Procedures:  none Antibiotics:  none   If 7PM-7AM, please contact night-coverage [email or web address] Password [email or web address] 04/17/2012, 7:14 AM   LOS: 3 days   HPI/Subjective: No acute events overnight.  Objective: Filed Vitals:   04/17/12 0300 04/17/12 0400 04/17/12 0500 04/17/12 0600  BP: 123/48 118/63 87/46 108/56  Pulse: 73 73 73 72  Temp:  98.2 F (36.8 C)    TempSrc:  Oral    Resp: 17 19 17 16   Height:      Weight:  67.7 kg (149 lb 4 oz)    SpO2: 100% 100% 98% 99%    Intake/Output Summary (Last 24 hours) at 04/17/12 8295 Last data filed at 04/17/12  0600  Gross per 24 hour  Intake 1256.25 ml  Output    425 ml  Net 831.25 ml    Exam:   General:  Pt is alert, confused, in no acute distress  Cardiovascular: Paced rhythm  Respiratory:  Clear to auscultation bilaterally, no wheezing, no crackles, no rhonchi  Abdomen: Soft, non tender, non distended, bowel sounds present, no guarding  Extremities: No edema, pulses DP and PT palpable bilaterally  Neuro: Grossly nonfocal  Data Reviewed: Basic Metabolic Panel:  Lab 04/17/12 1610 04/16/12 0353 04/15/12 0343 04/14/12 1718  NA 140 139 137 135  K 3.5 3.7 3.9 3.3*  CL 105 101 98 94*  CO2 28 31 31 30   GLUCOSE 144* 146* 159* 154*  BUN 20 19 17 16   CREATININE 0.77 0.97 1.04 1.00  CALCIUM 8.4 8.8 8.7 9.3  MG 2.1 -- -- --   Liver Function Tests:  Lab 04/15/12 0343 04/14/12 1945  AST 20 18  ALT 11 11  ALKPHOS 83 85  BILITOT 0.4 0.5  PROT 6.6 6.7  ALBUMIN 3.0* 3.1*   CBC:  Lab 04/17/12 0345 04/16/12 0353 04/15/12 0343 04/14/12 1718  WBC 12.6* 13.8* 14.1* 9.9  HGB 9.5* 9.7* 10.9* 12.0  HCT 29.9* 30.7* 32.6* 35.9*  MCV 96.5 96.2 93.7 92.1  PLT 142* 162 221 250   Cardiac Enzymes:  Lab 04/15/12 0904 04/15/12 0343 04/14/12 1955 04/14/12 1945  CKTOTAL -- -- 49 --  CKMB -- -- 3.0 --  TROPONINI 0.60* 0.88* -- 0.46*   CBG:  Lab 04/16/12 0842 04/15/12 1646 04/15/12 1212 04/15/12 0804  GLUCAP 130* 135* 130* 149*    MRSA PCR SCREENING     Status: Normal   Collection Time   04/14/12  9:06 PM      Component Value Range Status Comment   MRSA by PCR NEGATIVE  NEGATIVE Final      Studies: No results found.  Scheduled Meds:  . aspirin EC  81 mg Oral Daily  . chlorhexidine  15 mL Mouth Rinse BID  . escitalopram  10 mg Oral QPM  . levothyroxine  100 mcg Oral QAC breakfast  . lisinopril  2.5 mg Oral BID  . metoprolol  12.5 mg Oral BID

## 2012-04-17 NOTE — Evaluation (Signed)
Clinical/Bedside Swallow Evaluation Patient Details  Name: Amber Love MRN: [id number] Date of Birth: 02/07/1921  Today's Date: 04/17/2012 Time:  -     Past Medical History:  Past Medical History  Diagnosis Date  . Bladder cancer   . CVA (cerebral infarction)   . Hyperthyroidism   . Bradycardia   . Paroxysmal atrial fibrillation   . Hypertension    Past Surgical History:  Past Surgical History  Procedure Date  . Pacemaker insertion     medtronic   HPI:  HPI: Amber Love is a 78 y.o. female came to [facility] ed 04/14/2012 with H/O mechanical fall presented with a fall.     Assessment / Plan / Recommendation Clinical Impression  Patient exhibits an oral phase dysphagia, with muching pattern, rather than rotary motion of the jaw, as frequently seen in patient's with dementia.  No pharyngeal symptoms were noted, as there was no cough, throat clearing, and swallow initiation was timely and voice clear after the swallow.  Patient does have significant confusion and decreased awareness of the mechanical bolus presented, requiring frequent verbal cues to continue to chew and to swallow.  This combined with her inability to sit upright for p.o.'s increases her risk for aspiration.  Pt. could not sit up higher than a 40 degree angle without severe pain in her left hip.  As this evaluation was being completed, the orthopedic surgeon came in to examine the pt. and concluded that surgery was necessary, and will be completed later tonight.  Patient is now NPO for surgery, and will need a repeat swallow evaluation following  extubation and prior to resuming p.o.'s.    Aspiration Risk  Mild    Diet Recommendation NPO (NPO for surgery)        Other  Recommendations Recommended Consults:  (BSE or MBS prior to p.o.'s after surgery.) Oral Care Recommendations: Oral care QID;Staff/trained caregiver to provide oral care   Follow Up Recommendations  Skilled Nursing facility    Frequency and  Duration        Pertinent Vitals/Pain n/a    SLP Swallow Goals     Swallow Study Prior Functional Status       General Date of Onset: 04/17/12 HPI: HPI: Amber Love is a 78 y.o. female came to [facility] ed 04/14/2012 with H/O mechanical fall presented with a fall.   Type of Study: Bedside swallow evaluation Diet Prior to this Study: Regular Temperature Spikes Noted: No History of Recent Intubation: No Behavior/Cognition: Alert;Confused;Requires cueing;Distractible;Decreased sustained attention Oral Cavity - Dentition: Dentures, top;Dentures, bottom Self-Feeding Abilities: Total assist Patient Positioning: Partially reclined (Unable to sit up > 40 degrees.) Baseline Vocal Quality: Clear Volitional Cough: Cognitively unable to elicit Volitional Swallow: Unable to elicit    Oral/Motor/Sensory Function Overall Oral Motor/Sensory Function: Impaired (Appears to have an oral apraxia.) Labial ROM: Within Functional Limits Lingual ROM: Within Functional Limits Lingual Symmetry: Within Functional Limits Lingual Strength: Within Functional Limits Facial ROM: Within Functional Limits Facial Symmetry: Within Functional Limits Facial Strength: Within Functional Limits   Ice Chips Ice chips: Within functional limits Presentation: Spoon   Thin Liquid Thin Liquid: Within functional limits Presentation: Straw (Unable to suck on straw, placed small amount from end of str)    Nectar Thick Nectar Thick Liquid: Not tested   Honey Thick Honey Thick Liquid: Not tested   Puree Puree: Within functional limits Presentation: Spoon   Solid   GO    Solid: Impaired Oral Phase Impairments: Reduced lingual movement/coordination;Poor  awareness of bolus (Required verbal cues to continue to chew.) Oral Phase Functional Implications: Oral residue (Anterior chewing, munching pattern.)       Maryjo Rochester T 04/17/2012,10:33 AM

## 2012-04-17 NOTE — Progress Notes (Signed)
Subjective:    Awake, alert O x 2. Baseline dementia. Severe pain with any movement of the left hip Spoke with daughter and we agree that she is having severe pain and is not going to do Well if the left hip fracture pain is not controlled. I recommend that she have surgery Today as she appears stable though there is high risk. I don't think there is any advantage At this time to waiting as the risk is not going to improve any further. The daughter is  Working but will come in and leave work early to sign a consent at about 2 PM. The  Risks and benefits have been discussed including risks of infection, bleeding. Risks to the Sciatic nerve and risk of death due to cardiac disease and acute stress of surgery. The daughter understands and agrees to the proposed surgery to insert a IM Nail Into the left thigh femoral canal and screws to fix the left hip surgery.  Patient reports pain as marked.    Objective:   VITALS:  Temp:  [97.9 F (36.6 C)-99.7 F (37.6 C)] 98.3 F (36.8 C) (12/03 0800) Pulse Rate:  [62-81] 67  (12/03 0800) Resp:  [13-22] 18  (12/03 0800) BP: (83-123)/(41-63) 116/48 mmHg (12/03 0800) SpO2:  [95 %-100 %] 99 % (12/03 0800) Weight:  [67.7 kg (149 lb 4 oz)] 67.7 kg (149 lb 4 oz) (12/03 0400)  Neurologically intact ABD soft Sensation intact distally Intact pulses distally Dorsiflexion/Plantar flexion intact No cellulitis present Compartment soft   LABS  Basename 04/17/12 0345 04/16/12 0353 04/15/12 0343 04/14/12 1718  HGB 9.5* 9.7* 10.9* 12.0  WBC 12.6* 13.8* -- --  PLT 142* 162 -- --    Basename 04/17/12 0345 04/16/12 0353  NA 140 139  K 3.5 3.7  CL 105 101  CO2 28 31  BUN 20 19  CREATININE 0.77 0.97  GLUCOSE 144* 146*    Basename 04/15/12 0343 04/14/12 1945  LABPT -- --  INR 1.75* 2.29*     Assessment/Plan:    Left low femoral neck fracture vs high intertrochanteric hip fracture. High risk due to CAD EF 35-40%  Plan: OR contacted and we  have scheduled her as an add on case for this Evening at about 6 PM. Daughter will be by this afternoon to sign consent Ordered. Type and screen for OR for 2 units.   Mikalah Skyles E 04/17/2012, 10:23 AM   

## 2012-04-17 NOTE — Brief Op Note (Signed)
04/14/2012 - 04/17/2012  7:21 PM  PATIENT:  Amber Love  78 y.o. female  PRE-OPERATIVE DIAGNOSIS:  left hip fracture, basicervical vs high intertrochanteric hip fracture  POST-OPERATIVE DIAGNOSIS:  left hip fracture, basicervical vs high intertrochanteric hip fracture  PROCEDURE:  Procedure(s) (LRB) with comments: INTRAMEDULLARY (IM) NAIL FEMORAL (Left) Biomet  Affixus Trochanteric IM Nail 130 degree with proximal 95 mm lag screw and 75mm antirotation screw.  SURGEON:  Surgeon(s) and Role:    Kerrin Champagne, MD - Primary  ANESTHESIA:   spinal, Dr. Acey Lav  EBL:   200cc  BLOOD ADMINISTERED:none  DRAINS: Urinary Catheter (Foley)   LOCAL MEDICATIONS USED:  NONE  SPECIMEN:  No Specimen  DISPOSITION OF SPECIMEN:  N/A  COUNTS:  YES  TOURNIQUET:  * No tourniquets in log *  DICTATION: .Dragon Dictation  PLAN OF CARE: Admit to inpatient   PATIENT DISPOSITION:  PACU - hemodynamically stable.   Delay start of Pharmacological VTE agent (>24hrs) due to surgical blood loss or risk of bleeding: no

## 2012-04-17 NOTE — H&P (View-Only) (Signed)
Subjective:    Awake, alert O x 2. Baseline dementia. Severe pain with any movement of the left hip Spoke with daughter and we agree that she is having severe pain and is not going to do Well if the left hip fracture pain is not controlled. I recommend that she have surgery Today as she appears stable though there is high risk. I don't think there is any advantage At this time to waiting as the risk is not going to improve any further. The daughter is  Working but will come in and leave work early to sign a consent at about 2 PM. The  Risks and benefits have been discussed including risks of infection, bleeding. Risks to the Sciatic nerve and risk of death due to cardiac disease and acute stress of surgery. The daughter understands and agrees to the proposed surgery to insert a IM Nail Into the left thigh femoral canal and screws to fix the left hip surgery.  Patient reports pain as marked.    Objective:   VITALS:  Temp:  [97.9 F (36.6 C)-99.7 F (37.6 C)] 98.3 F (36.8 C) (12/03 0800) Pulse Rate:  [62-81] 67  (12/03 0800) Resp:  [13-22] 18  (12/03 0800) BP: (83-123)/(41-63) 116/48 mmHg (12/03 0800) SpO2:  [95 %-100 %] 99 % (12/03 0800) Weight:  [67.7 kg (149 lb 4 oz)] 67.7 kg (149 lb 4 oz) (12/03 0400)  Neurologically intact ABD soft Sensation intact distally Intact pulses distally Dorsiflexion/Plantar flexion intact No cellulitis present Compartment soft   LABS  Basename 04/17/12 0345 04/16/12 0353 04/15/12 0343 04/14/12 1718  HGB 9.5* 9.7* 10.9* 12.0  WBC 12.6* 13.8* -- --  PLT 142* 162 -- --    Basename 04/17/12 0345 04/16/12 0353  NA 140 139  K 3.5 3.7  CL 105 101  CO2 28 31  BUN 20 19  CREATININE 0.77 0.97  GLUCOSE 144* 146*    Basename 04/15/12 0343 04/14/12 1945  LABPT -- --  INR 1.75* 2.29*     Assessment/Plan:    Left low femoral neck fracture vs high intertrochanteric hip fracture. High risk due to CAD EF 35-40%  Plan: OR contacted and we  have scheduled her as an add on case for this Evening at about 6 PM. Daughter will be by this afternoon to sign consent Ordered. Type and screen for OR for 2 units.   NITKA,JAMES E 04/17/2012, 10:23 AM

## 2012-04-17 NOTE — Op Note (Signed)
04/14/2012 - 04/17/2012  7:25 PM  PATIENT:  Amber Love  78 y.o. female  MRN: [id number]  OPERATIVE REPORT  PRE-OPERATIVE DIAGNOSIS:  left hip fracture, basicervical vs high intertrochanteric hip fracture   POST-OPERATIVE DIAGNOSIS:  left hip fracture, basicervical vs high intertrochanteric hip fracture   PROCEDURE:  Procedure(s): INTRAMEDULLARY (IM) NAIL FEMORAL(Left) Biomet Affixus Trochanteric IM Nail 130 degree with proximal 95 mm lag screw and 75mm antirotation screw. And two distal interlocking screws.     SURGEON:  Kerrin Champagne, MD       ANESTHESIA:  General, Dr. Acey Lav  COMPLICATIONS:  None.     COMPONENTS: Biomet Affixus Trochanteric IM Nail 130 degree with proximal 95 mm lag screw and 75mm antirotation screw. And two distal interlocking screws.  EBL: 200 cc  PROCEDURE: The patient was met in the holding area, and the appropriate left hip identified and marked with an "X" and my initials. The patient was then transported to OR and was placed under anesthesia in supine position in her bed then transferred to the [facility] fracture table.  The patient received appropriate preoperative antibiotic prophylaxis 2 grams ancef. The left foot and ankle well padded placed into longitudinal traction with leg brought into adduction. A groin post was used and the right leg placed in the well leg holder abducted to allow for C-arm evaluation of the left hip. The left lower extremity was then prepped circumferentially about the left knee thigh hip to the lower rib margin laterally using DuraPrep solution. A iodine exclusion Vi-Drape was used for this case. C-arm fluoroscopy was used to ascertain reduction of the left intertrochanteric hip fracture using abduction longitudinal traction and then internal rotation. Fracture reduced nicely into anatomic position and alignment. Following standard timeout protocol, then an incision was made using C-arm fluoroscopy guidance  approximately 2 cm proximal to the tip of the greater trochanter in line with the left proximal femur. Incision through skin and subcutaneous layers the tensor fascia incised in line with the skin incision approximately 1-1/2 inch incision was used. The tip of the greater trochanter palpated and a cannulated awl guided to the tip of the trochanter observed on C-arm fluoroscopy to be in good position alignment of this cannulated awl was then carefully guided into the proximal femoral medullary canal from the tip of the trochanter. Guide pin for the affixus  system was then placed through the cannulated awl into the femoral channel and passed down to the distal femoral physis scar. Measured for length a 340 mm length nail was chosen using the length guide passed over the guide pin to the tip of the trochanter. The proximal reamer it was then guided to the tip of the greater trochanter through the soft tissue sleeve protector and the reamer then used to ream the proximal intramedullary canal. This was reamed beyond the fracture site be on lesser trochanter. A 13 mm reamer was then passed past the length of the intramedullary canal allowing a millimeter and a half tolerance for the 11 mm x 340 mm of Affixus nail. The nail was properly aligned the holes for the proximal lag screw directed to the lower half of the femoral neck and head. Using the insertion handle guide sleeves were then passed for the small lag screw down to the skin and a stab incision made using a 10 blade scalpel through skin and subcutaneous layers spreading the subcutaneous layers down to bone using tonsil clamp. Sleeves were then passed down to bone  with the centering sleeve for the drill for the lag screw down to bone.First the sleeve and drill sleeve for the proximal antirotation screw was passed to the lateral femoral cortex and a guide pin passed into the upper 1/2 of the femoral neck and head. Then the sleeve and inner guide pin sleeve for the  proximal lag screw was passed down to the lateral femoral cortex. Carefully then the guide pin for the proximal lag screw was then passed this was carefully placed into the middle portion of the head and neck on the AP view and the midportion of the head and neck on lateral view. It was passed down to subchondral bone and measured at however 95 mm lag screw was chosen as the fracture was felt to be somewhat distracted. 95 mm screw was chosen reaming to 100 mm. Reaming was only able to be carried out to 100 mm under direct C-arm fluoroscopy this provided for a reaming to approximately 3 mm short of subchondral bone.  With this then 95 mm lag screw was passed over the guide pin and carefully screwed into place down to subchondral bone and off of the subchondral bone about 2 mm to 3 mm the handle placed at 90 to the femoral shaft. The proximal antirotation guide pin was then removed and replaced with a 75 mm antirotation screw. The longitudinal traction on the leg was released and the lag screw compression mechanism placed and excellent reduction of the fracture site obtained.The insertion handle and guide pin was then removed and the proximal antirotation screw screwed down to appropriate depth.  Proximal locking mechanism for the lag screw was then tightened using the articulated hex driver. This was tightened completely and then backed off a quarter turn to allow for dynamization of the proximal lag screw. Note that the lag screw was then carefully tightened after releasing longitudinal traction on the leg using the fracture site quite nicely and providing compression across the lag screw fix this nail interface. With this the insertion handle for the proximal lag screw was then removed with the sleeve and the insertion handle and guidepin.  Attention was then turned to the distal interlocking screw placement. Permanent C-arm images were obtained in AP lateral planes the proximal fixation and fracture site.  C-arm then brought through a direct lateral to the distal femur IM nail locking holes. On C-arm the holes were brought to the fullest extent indicating the C-arm was parallel to the holes. Tonsil clamp used to identify the skin directly deep to the distal interlocking screw holes and marks made on the skin. 10 blade scalpel used to make stab incisions in the skin and hemostat used to spread of the skin and subcutaneous layers down to bone. The 4.5 mm drill was then carefully applied to the lateral aspect of the femur and proximal interlocks screw hole and centered on the screw hole seen on fluoroscopy. Freehand drilling was then performed of the proximal screw hole through the superficial femoral cortex laterally through the nail then the deeper cortex leaving the drill bit in place and observing on C-arm fluoroscopy the drill position through the nail and its direction. Depth gauge then measured the distance between the medial and lateral cortex of the distal femur at this hole site. Appropriate sized screw was then placed through the proximal hole and the screw observed to be engaging the nail and the opposite cortex. Same sequences then used to drill a hole through the distal of the 2  distal interlock screw holes again positioning the 4.5 drill bit against the lateral cortex observing on C-arm fluoroscopy this to be within the center of the distal eccentric interlocking hole of the nail. Then drilling free and in the correct direction observing on C-arm fluoroscopy the nail opening filled with the drill bit as this hole was prepared. Measuring depth and placing appropriate screw. Permanent images were then obtained AP and lateral planes lateral planes demonstrating the groove in good position alignment AP however showed the screw slightly long so that they were backed off turned and had to even out any overlying both lateral and medial. His provided excellent fixation of the distal nail. Irrigation was then  carried out of all the incision sites after obtaining permanent C-arm images in AP and lateral planes. The 2 stab incisions distally were then closed with subcutaneous sutures of 2-0 Vicryl. The most proximal incision site was closed by irrigating first and approximating the tensor fascia lata with interrupted 0 Vicryl sutures on a UR 6 needle. Deep subcutaneous layers were approximated with 0 Vicryl suture and the subcutaneous layers with 2-0 Vicryl. Similarly the incision for the lag screw was closed with 0 Vicryl sutures in the deep layers then 2-0 Vicryl subcutaneous. Skin was then closed at each of the incision sites with stainless steel staples. Mepilex small bandages 3 in number were then applied to the lateral and the distal thigh area. All instrument and sponge counts were correct. Patient was then removed from longitudinal traction returned to her bed reactivated extubated and returned to recovery room in satisfactory condition.   Arron Tetrault E 04/17/2012, 7:25 PM

## 2012-04-17 NOTE — Anesthesia Postprocedure Evaluation (Signed)
  Anesthesia Post-op Note  Patient: Amber Love  Procedure(s) Performed: Procedure(s) (LRB): INTRAMEDULLARY (IM) NAIL FEMORAL (Left)  Patient Location: PACU  Anesthesia Type: General  Level of Consciousness: awake and alert confused  Airway and Oxygen Therapy: Patient Spontanous Breathing  Post-op Pain: mild  Post-op Assessment: Post-op Vital signs reviewed, Patient's Cardiovascular Status Stable, Respiratory Function Stable, Patent Airway and No signs of Nausea or vomiting  Last Vitals:  Filed Vitals:   04/17/12 2050  BP: 94/57  Pulse: 90  Temp: 36.4 C  Resp: 22    Post-op Vital Signs: stable   Complications: No apparent anesthesia complications. To ICU on Neo drip

## 2012-04-17 NOTE — Progress Notes (Signed)
PROGRESS NOTE  Subjective:   Amber Love is an 78 yo admitted after a fall and subsequent left hip fracture.  She was noted to have + Troponin levels and we were consulted for further evaluation.   The echo reveals apical akinesis with EF of 40% - the contractility pattern is c/w Takotsubo cardiomyopathy.   She is pleasantly demented.   Objective:    Vital Signs:   Temp:  [97.9 F (36.6 C)-100 F (37.8 C)] 98.2 F (36.8 C) (12/03 0400) Pulse Rate:  [62-84] 73  (12/03 0700) Resp:  [13-22] 16  (12/03 0700) BP: (83-123)/(41-63) 102/48 mmHg (12/03 0700) SpO2:  [95 %-100 %] 98 % (12/03 0700) Weight:  [149 lb 4 oz (67.7 kg)] 149 lb 4 oz (67.7 kg) (12/03 0400)  Last BM Date:  (PTA)   24-hour weight change: Weight change:   Weight trends: Filed Weights   May 09, 2012 1705 05/09/2012 2240 04/17/12 0400  Weight: 150 lb (68.04 kg) 147 lb 7.8 oz (66.9 kg) 149 lb 4 oz (67.7 kg)    Intake/Output:  12/02 0701 - 12/03 0700 In: 1256.3 [I.V.:1256.3] Out: 425 [Urine:425]     Physical Exam: BP 102/48  Pulse 73  Temp 98.2 F (36.8 C) (Oral)  Resp 16  Ht 5\' 4"  (1.626 m)  Wt 149 lb 4 oz (67.7 kg)  BMI 25.62 kg/m2  SpO2 98%  General: Vital signs reviewed and noted.  Patient feels warm.  Head: Normocephalic, atraumatic.  Eyes: conjunctivae/corneas clear.  EOM's intact.   Throat: normal  Neck: Thin,   Lungs:  clear  Heart: RR, distant heart sounds, soft systolic murmur  Abdomen:  Soft, non-tender, non-distended    Extremities: No edema,    Neurologic: Demented   Psych: Demented, does not know why she is here    Labs: BMET:  Basename 04/17/12 0345 04/16/12 0353  NA 140 139  K 3.5 3.7  CL 105 101  CO2 28 31  GLUCOSE 144* 146*  BUN 20 19  CREATININE 0.77 0.97  CALCIUM 8.4 8.8  MG 2.1 --  PHOS -- --    Liver function tests:  [facility] 04/15/12 0343 05-09-12 1945  AST 20 18  ALT 11 11  ALKPHOS 83 85  BILITOT 0.4 0.5  PROT 6.6 6.7  ALBUMIN 3.0* 3.1*   No results  found for this basename: LIPASE:2,AMYLASE:2 in the last 72 hours  CBC:  Basename 04/17/12 0345 04/16/12 0353 May 09, 2012 1718  WBC 12.6* 13.8* --  NEUTROABS -- -- 7.8*  HGB 9.5* 9.7* --  HCT 29.9* 30.7* --  MCV 96.5 96.2 --  PLT 142* 162 --    Cardiac Enzymes:  Basename 04/15/12 0904 04/15/12 0343 May 09, 2012 1955 May 09, 2012 1945  CKTOTAL -- -- 49 --  CKMB -- -- 3.0 --  TROPONINI 0.60* 0.88* -- 0.46*    Coagulation Studies:  Basename 04/15/12 0343 05/09/2012 1945 2012-05-09 1718  LABPROT 19.8* 24.2* 23.4*  INR 1.75* 2.29* 2.19*    Other: No components found with this basename: POCBNP:3 No results found for this basename: DDIMER in the last 72 hours  Basename 05/09/2012 1718  HGBA1C 6.2*      Tele:  A-fib  Medications:    Infusions:    . [EXPIRED] sodium chloride 75 mL/hr at 04/16/12 2315  . sodium chloride    . [DISCONTINUED] sodium chloride 50 mL/hr at 04/16/12 0445    Scheduled Medications:    . antiseptic oral rinse  15 mL Mouth Rinse q12n4p  . aspirin EC  81 mg Oral Daily  . chlorhexidine  15 mL Mouth Rinse BID  . escitalopram  10 mg Oral QPM  . levothyroxine  100 mcg Oral QAC breakfast  . lisinopril  2.5 mg Oral BID  . metoprolol  12.5 mg Oral BID  . sodium chloride  3 mL Intravenous Q12H    Assessment/ Plan:    1. CAD / NSTEMI  The echo shows apical akinesis with moderate LV dysfunction.  The contractility pattern is c/w Takotsubo cardiomyopathy.  Another possibility is that she has had a previous apical MI.  Her Troponin levels are not very elevated so I do not think that she has had a recent apical MI  She has dementia and is not able to tell us any history.  She is not in shock and her HR and BP are normal this am.  I doubt she is having an MI currently.     Given this information, she is at moderate- high risk for CV complications with her hip repair but I do not think we have any other options but to fix her hip.   Final decision will be up to the  Internal Medicine and Ortho services.      Continue metoprolol 12.5 BID Add Lisinopril 2.5 BID - this will help her LV function improve.  2. Systolic congestive heart failure:  I do not know at this point whether the LV dysfunction is acute or chronic.  It it improves, we will assume this was a Takotsubo cardiomyopathy caused by coronary spasm ( and therefore this would be acute systolic CHF).  If it does not improve, we would be able to say that this is chronic systolic CHF.  This improvement will occur over the next several months.  I do not anticipate seeing  any improvement in her LV function in the short term.   Continue metoprolol 12.5 BID and  Lisinopril 2.5 BID - this will help her LV function improve. No further recs at this time.  Pacemaker (10/14/2010) Currently pacing   Falls (11/07/2011) She has a hx of falls for the past several months.  She is at risk for significant hemorrhage if we continue the anticoagulation but she is also at high risk for recurrent CVA with her atrial fib.  Displaced intertrochanteric fracture of left femur (04/14/2012) Plans per ortho.  Possible surgical repair today.  Disposition:  Length of Stay: 3  Vesta Mixer, Montez Hageman., MD, [facility] 04/17/2012, 7:54 AM Office [phone] Pager [phone]

## 2012-04-17 NOTE — Interval H&P Note (Signed)
History and Physical Interval Note:  04/17/2012 5:37 PM  Amber Love  has presented today for surgery, with the diagnosis of left hip fracture  The various methods of treatment have been discussed with the patient and family. After consideration of risks, benefits and other options for treatment, the patient has consented to  Procedure(s) (LRB) with comments: INTRAMEDULLARY (IM) NAIL FEMORAL (Left) as a surgical intervention .  The patient's history has been reviewed, patient examined, no change in status, stable for surgery.  I have reviewed the patient's chart and labs.  Questions were answered to the patient's satisfaction.     NITKA,JAMES E  Patient was seen and examined in the preop holding area. There has been no interval  Change in this patient's exam preop  history and physical exam  Lab tests and images have been examined and reviewed.  The Risks benefits and alternative treatments have been discussed  extensively,questions answered.  The patient has elected to undergo the discussed surgical treatment.

## 2012-04-17 NOTE — Transfer of Care (Addendum)
Immediate Anesthesia Transfer of Care Note  Patient: Amber Love  Procedure(s) Performed: Procedure(s) (LRB): INTRAMEDULLARY (IM) NAIL FEMORAL (Left)  Patient Location: PACU  Anesthesia Type: Spinal  Level of Consciousness: sedated, patient cooperative and responds to stimulaton  Airway & Oxygen Therapy: Patient Spontanous Breathing and Patient connected to face mask oxgen  Post-op Assessment: Report given to PACU RN and Post -op Vital signs reviewed and stable  Post vital signs: Reviewed and stable  Complications: No apparent anesthesia complications  Unable to assess spinal level due to patients LOC

## 2012-04-18 ENCOUNTER — Inpatient Hospital Stay (HOSPITAL_COMMUNITY): Payer: Medicare Other

## 2012-04-18 DIAGNOSIS — I959 Hypotension, unspecified: Secondary | ICD-10-CM

## 2012-04-18 DIAGNOSIS — J9601 Acute respiratory failure with hypoxia: Secondary | ICD-10-CM

## 2012-04-18 DIAGNOSIS — D72829 Elevated white blood cell count, unspecified: Secondary | ICD-10-CM

## 2012-04-18 LAB — COMPREHENSIVE METABOLIC PANEL
ALT: 134 U/L — ABNORMAL HIGH (ref 0–35)
AST: 301 U/L — ABNORMAL HIGH (ref 0–37)
Albumin: 2 g/dL — ABNORMAL LOW (ref 3.5–5.2)
CO2: 22 mEq/L (ref 19–32)
Chloride: 108 mEq/L (ref 96–112)
GFR calc non Af Amer: 40 mL/min — ABNORMAL LOW (ref >90)
Potassium: 3.8 mEq/L (ref 3.5–5.1)
Sodium: 143 mEq/L (ref 135–145)
Total Bilirubin: 0.7 mg/dL (ref 0.3–1.2)

## 2012-04-18 LAB — TYPE AND SCREEN
ABO/RH(D): A POS
Antibody Screen: NEGATIVE
Unit division: 0
Unit division: 0

## 2012-04-18 LAB — BASIC METABOLIC PANEL
BUN: 26 mg/dL — ABNORMAL HIGH (ref 6–23)
CO2: 24 mEq/L (ref 19–32)
Calcium: 8.4 mg/dL (ref 8.4–10.5)
Chloride: 102 mEq/L (ref 96–112)
Creatinine, Ser: 0.98 mg/dL (ref 0.50–1.10)
Glucose, Bld: 198 mg/dL — ABNORMAL HIGH (ref 70–99)

## 2012-04-18 LAB — BLOOD GAS, ARTERIAL
Acid-base deficit: 6.6 mmol/L — ABNORMAL HIGH (ref 0.0–2.0)
Bicarbonate: 17.9 mEq/L — ABNORMAL LOW (ref 20.0–24.0)
O2 Saturation: 99.1 %
Patient temperature: 98.6
TCO2: 16.1 mmol/L (ref 0–100)

## 2012-04-18 LAB — URINE MICROSCOPIC-ADD ON

## 2012-04-18 LAB — CBC WITH DIFFERENTIAL/PLATELET
Basophils Absolute: 0 10*3/uL (ref 0.0–0.1)
Basophils Relative: 0 % (ref 0–1)
HCT: 26.9 % — ABNORMAL LOW (ref 36.0–46.0)
Lymphocytes Relative: 11 % — ABNORMAL LOW (ref 12–46)
MCHC: 32.7 g/dL (ref 30.0–36.0)
Neutro Abs: 9.4 10*3/uL — ABNORMAL HIGH (ref 1.7–7.7)
Neutrophils Relative %: 79 % — ABNORMAL HIGH (ref 43–77)
Platelets: 141 10*3/uL — ABNORMAL LOW (ref 150–400)
RDW: 12.5 % (ref 11.5–15.5)
WBC: 11.9 10*3/uL — ABNORMAL HIGH (ref 4.0–10.5)

## 2012-04-18 LAB — URINALYSIS, ROUTINE W REFLEX MICROSCOPIC
Nitrite: POSITIVE — AB
Specific Gravity, Urine: 1.046 — ABNORMAL HIGH (ref 1.005–1.030)
pH: 5 (ref 5.0–8.0)

## 2012-04-18 LAB — CREATININE, URINE, RANDOM: Creatinine, Urine: 206.1 mg/dL

## 2012-04-18 LAB — GLUCOSE, CAPILLARY

## 2012-04-18 LAB — SODIUM, URINE, RANDOM: Sodium, Ur: 10 mEq/L

## 2012-04-18 LAB — CBC
HCT: 30.1 % — ABNORMAL LOW (ref 36.0–46.0)
MCH: 30.4 pg (ref 26.0–34.0)
MCV: 96.2 fL (ref 78.0–100.0)
Platelets: 181 10*3/uL (ref 150–400)
RBC: 3.13 MIL/uL — ABNORMAL LOW (ref 3.87–5.11)

## 2012-04-18 LAB — MAGNESIUM: Magnesium: 2 mg/dL (ref 1.5–2.5)

## 2012-04-18 MED ORDER — SODIUM CHLORIDE 0.9 % IV BOLUS (SEPSIS)
2000.0000 mL | Freq: Once | INTRAVENOUS | Status: AC
  Administered 2012-04-18: 2000 mL via INTRAVENOUS

## 2012-04-18 MED ORDER — SODIUM CHLORIDE 0.9 % IV BOLUS (SEPSIS)
500.0000 mL | Freq: Once | INTRAVENOUS | Status: AC
  Administered 2012-04-18: 500 mL via INTRAVENOUS

## 2012-04-18 MED ORDER — VANCOMYCIN HCL IN DEXTROSE 1-5 GM/200ML-% IV SOLN
1000.0000 mg | INTRAVENOUS | Status: DC
  Administered 2012-04-19 – 2012-04-20 (×2): 1000 mg via INTRAVENOUS
  Filled 2012-04-18 (×2): qty 200

## 2012-04-18 MED ORDER — SODIUM CHLORIDE 0.9 % IV SOLN: INTRAVENOUS | Status: DC

## 2012-04-18 MED ORDER — DEXTROSE 5 % IV SOLN
1.0000 g | INTRAVENOUS | Status: DC
  Administered 2012-04-18: 1 g via INTRAVENOUS
  Filled 2012-04-18: qty 10

## 2012-04-18 MED ORDER — SODIUM CHLORIDE 0.9 % IV SOLN
INTRAVENOUS | Status: DC
  Administered 2012-04-18: 08:00:00 via INTRAVENOUS

## 2012-04-18 MED ORDER — PIPERACILLIN-TAZOBACTAM 3.375 G IVPB
3.3750 g | Freq: Three times a day (TID) | INTRAVENOUS | Status: DC
  Administered 2012-04-18 – 2012-04-20 (×7): 3.375 g via INTRAVENOUS
  Filled 2012-04-18 (×8): qty 50

## 2012-04-18 MED ORDER — VANCOMYCIN HCL IN DEXTROSE 1-5 GM/200ML-% IV SOLN
1000.0000 mg | Freq: Once | INTRAVENOUS | Status: AC
  Administered 2012-04-18: 1000 mg via INTRAVENOUS
  Filled 2012-04-18: qty 200

## 2012-04-18 MED ORDER — RIVAROXABAN 10 MG PO TABS
10.0000 mg | ORAL_TABLET | Freq: Every day | ORAL | Status: DC
  Administered 2012-04-18 – 2012-04-26 (×10): 10 mg via ORAL
  Filled 2012-04-18 (×10): qty 1

## 2012-04-18 NOTE — Progress Notes (Signed)
Notified [facility] NP of pt's low urine output. NP ordered increase of IV fluids to 156ml/hr. Will continue to monitor.  Langley Gauss, RN

## 2012-04-18 NOTE — Progress Notes (Signed)
Patient examined and lab reviewed with Vernon PA-C. 

## 2012-04-18 NOTE — Progress Notes (Signed)
Text paged [facility] NP notifying of pt's continued low urine output. Over 11 hrs, pt had urine output. Will continue to monitor.

## 2012-04-18 NOTE — Progress Notes (Signed)
[facility] PROGRESS NOTE  Amber Love ZOX:[id number] DOB: 07/27/1920 DOA: 04/14/2012 PCP: Josue Hector, MD  Assessment/Plan:   Principal Problem:  *Displaced intertrochanteric fracture of left femur, POD 1 s/p INTRAMEDULLARY (IM) NAIL FEMORAL (Left) -  Partial weight bearing status -  Up with PT -  Continue xarelto for DVT prevention per orthopedics, but discontinue afterwards as patient is high risk for falls.    Decreased urine output, likely prerenal post-operatively and some ATN (granular casts) from hypoTN, however has JVP to angle of mandible. -  Urinalysis -  FENa -  Continue hydration  NSTEMI (non-ST elevated myocardial infarction) The highest troponin of 0.88 but has trended down to 0.6; no further cardiac enzymes obtained.  2D echo was done 04/15/2012 and showed ejection fraction 40-45% Continue aspirin, lisinopril and metoprolol HYPOTHYROIDISM  Continue levothyroxine 100 mg daily HYPERTENSION  With hypotension overnight that resolved with IVF.   Hold parameters on blood pressure medications metoprolol 12.5 mg twice daily lisinopril 2.5 mg twice daily PAROXYSMAL ATRIAL FIBRILLATION with tachycardia and RVR to 140s briefly overnight.  Now in 37s.   Only on aspirin 81 mg daily Xarelto Rate control with metoprolol Acute on Chronic systolic heart failure  Based on 2-D echo done 04/15/2012 with ejection fraction 40-45% Continue management per cardiology with metoprolol and lisinopril Continue aspirin Leukocytosis, trending down and patient afebrile.  Tachypneic and evidence of UTI on previous UA (elevated WBC and nitrite positive).  Patient received perioperative ABX CXR (also to evaluate for pulmonary edema after fluids overnight) F/u urine culture   Anemia normocytic, stable Hemoglobin remains stable in range 9.5-9.7 Moderate protein calorie malnutrition  Secondary to poor oral intake secondary to dementia  We will obtain SLP evaluation DVT  prophylaxis  SCDs bilaterally  DIET:   Healthy haert ACCESS:  PIV IVF:  NS at 69ml/h PROPH:  Xarelto, SCDs  Code Status: DNR  Family Communication:  Patient alone, family not at bedside Disposition Plan: likely SNF placement pending stable blood pressure and improvement in urine output  Consultants:  Cardiology  Orthopedics  Other consultants:  SLP evaluation  Physical therapy  Social work for skilled nursing facility placement Procedures:  Intramedullary nailing of left femur 12/3  Antibiotics:  Cefazolin 12/3  If 7PM-7AM, please contact night-coverage [email or web address] Password [email or web address] 04/18/2012, 11:00 AM   LOS: 4 days   HPI/Subjective: Hypotensive and tachycardic overnight.  Was started on IVF by nightfloat.  Patient unable to provide reliable ROS due to confusion  Objective: Filed Vitals:   04/18/12 0700 04/18/12 0800 04/18/12 0900 04/18/12 1030  BP: 95/80 101/63 92/41 111/59  Pulse: 102 109 58   Temp:  99.1 F (37.3 C)    TempSrc:  Axillary    Resp: 25 23 21    Height:      Weight:      SpO2: 98% 99% 98%     Intake/Output Summary (Last 24 hours) at 04/18/12 1100 Last data filed at 04/18/12 0900  Gross per 24 hour  Intake 1871.67 ml  Output    863 ml  Net 1008.67 ml    Exam:   General:  Caucasian female, no acute distress  Cardiovascular:  Irregularly irregular, tachycardic, 2/6 murmur at apex, no rubs or gallops  Neck:  JVP to angle of mandible  Respiratory: Rales at bilateral bases/dependent areas  Abdomen: Soft, diffusely tender without rebound or guarding.  No organomegaly  Extremities: No edema, pulses DP and PT palpable bilaterally  Neuro: Grossly nonfocal  Psych:  Alert and  oriented to person only.  Data Reviewed: Basic Metabolic Panel:  Lab 04/17/12 1610 04/16/12 0353 04/15/12 0343 04/14/12 1718  NA 140 139 137 135  K 3.5 3.7 3.9 3.3*  CL 105 101 98 94*  CO2 28 31 31 30   GLUCOSE 144* 146* 159* 154*  BUN 20 19 17 16    CREATININE 0.77 0.97 1.04 1.00  CALCIUM 8.4 8.8 8.7 9.3  MG 2.1 -- -- --   Liver Function Tests:  Lab 04/15/12 0343 04/14/12 1945  AST 20 18  ALT 11 11  ALKPHOS 83 85  BILITOT 0.4 0.5  PROT 6.6 6.7  ALBUMIN 3.0* 3.1*   CBC:  Lab 04/17/12 0345 04/16/12 0353 04/15/12 0343 04/14/12 1718  WBC 12.6* 13.8* 14.1* 9.9  HGB 9.5* 9.7* 10.9* 12.0  HCT 29.9* 30.7* 32.6* 35.9*  MCV 96.5 96.2 93.7 92.1  PLT 142* 162 221 250   Cardiac Enzymes:  Lab 04/15/12 0904 04/15/12 0343 04/14/12 1955 04/14/12 1945  CKTOTAL -- -- 49 --  CKMB -- -- 3.0 --  TROPONINI 0.60* 0.88* -- 0.46*   CBG:  Lab 04/18/12 0742 04/17/12 0747 04/16/12 0842 04/15/12 1646 04/15/12 1212  GLUCAP 193* 128* 130* 135* 130*    MRSA PCR SCREENING     Status: Normal   Collection Time   04/14/12  9:06 PM      Component Value Range Status Comment   MRSA by PCR NEGATIVE  NEGATIVE Final      Studies: Dg Femur Left  04/17/2012  *RADIOLOGY REPORT*  Clinical Data: Left femoral ORIF.  LEFT FEMUR - 2 VIEW  Comparison: Left hip radiographs 04/14/2012.  Findings: Four spot fluoroscopic images demonstrate the placement of a left femoral intramedullary nail with interlocking dynamic screws within the femoral neck.  The intertrochanteric fracture demonstrates near anatomic alignment.  There are two distal interlocking screws.  No complications are identified.  IMPRESSION: Near anatomic reduction of left femoral intertrochanteric fracture status post ORIF.   Original Report Authenticated By: Carey Bullocks, M.D.    Dg Pelvis Portable  04/18/2012  *RADIOLOGY REPORT*  Clinical Data: Status post internal fixation of left hip fracture.  PORTABLE PELVIS  Comparison: Intraoperative films performed earlier today at 06:44 p.m., and left hip radiographs performed 04/14/2012  Findings: There has been placement of an intramedullary rod within the proximal left femur, with associated screws through the patient's left femoral intertrochanteric  fracture, transfixing the fracture in grossly anatomic alignment.  The left femoral head remains seated at the acetabulum.  The right hip joint is grossly unremarkable in appearance.  Mild degenerative change is noted at the lower lumbar spine.  The sacroiliac joints are within normal limits.  Scattered soft tissue air is noted overlying the surgical site. The visualized bowel gas pattern is grossly unremarkable.  IMPRESSION: Status post placement of intramedullary rod within the proximal left femur, with screws transfixing the left femoral intertrochanteric fracture in grossly anatomic alignment.   Original Report Authenticated By: Tonia Ghent, M.D.    Dg C-arm 1-60 Min-no Report  04/17/2012  CLINICAL DATA: IM Nail left   C-ARM 1-60 MINUTES  Fluoroscopy was utilized by the requesting physician.  No radiographic  interpretation.      Scheduled Meds:  . aspirin EC  81 mg Oral Daily  . chlorhexidine  15 mL Mouth Rinse BID  . escitalopram  10 mg Oral QPM  . levothyroxine  100 mcg Oral QAC breakfast  . lisinopril  2.5 mg Oral BID  . metoprolol  12.5  mg Oral BID   Time spent:  30 minutes  Renae Fickle [facility]   Page [phone] between 7AM and 7PM.

## 2012-04-18 NOTE — Evaluation (Signed)
Occupational Therapy Evaluation Patient Details Name: Amber Love MRN: [id number] DOB: 10/08/1920 Today's Date: 04/18/2012 Time: 4098-1191 OT Time Calculation (min): 32 min  OT Assessment / Plan / Recommendation Clinical Impression  This 78 year old female with dementia fell and sustained a L displaced intertrochanteric fx of femur which was surgically managed with an IM nail.  Pt is wbat for transfers to chair.  At time of evaluation, she was not able to tolerate this.  She is total A for all adls--A x 2 for LB and using bedpan.  Will follow in acute with goals of toilet transfers and maintaining static standing for adls.      OT Assessment  Patient needs continued OT Services    Follow Up Recommendations  SNF    Barriers to Discharge      Equipment Recommendations  None recommended by OT;None recommended by PT    Recommendations for Other Services    Frequency  Min 1X/week    Precautions / Restrictions Precautions Precautions: Fall Restrictions Weight Bearing Restrictions: Yes LLE Weight Bearing: Weight bearing as tolerated Other Position/Activity Restrictions: WBAT for transfers and 50% PWB if ambulates.    Pertinent Vitals/Pain Pain L hip--not rated    ADL  Grooming: Performed;Wash/dry face;Wash/dry hands;Moderate assistance (attempted combing:  total A) Where Assessed - Grooming: Supported sitting (min A to min guard support) Transfers/Ambulation Related to ADLs: sat eob with min guard to min A support.  Pt needed to use bathroom--laid back down and placed bedpan underneath her, then cleaned her up.  Pt 0% for all mobility ADL Comments: total A for all adls:  except grooming as noted above.  Did not test feeding.  total A x 2 for LB adls and toileting on bed pan    OT Diagnosis: Generalized weakness;Cognitive deficits;Acute pain  OT Problem List: Decreased strength;Decreased activity tolerance;Impaired balance (sitting and/or standing);Decreased cognition;Decreased  knowledge of precautions;Cardiopulmonary status limiting activity;Pain OT Treatment Interventions:     OT Goals Acute Rehab OT Goals OT Goal Formulation: With family Time For Goal Achievement: 05/02/12 Potential to Achieve Goals: Fair ADL Goals Pt Will Transfer to Toilet: with 2+ total assist;Squat pivot transfer;Stand pivot transfer;3-in-1 (pt 30%) ADL Goal: Toilet Transfer - Progress: Goal set today Miscellaneous OT Goals Miscellaneous OT Goal #1: pt will go from sit to stand with total A x 2, pt 40% and maintain this with mod A  for 2 minutes for adls OT Goal: Miscellaneous Goal #1 - Progress: Goal set today Miscellaneous OT Goal #2: Pt will sit unsupported eob x 10 minutes in prep for seated adls with supervision OT Goal: Miscellaneous Goal #2 - Progress: Goal set today  Visit Information  Last OT Received On: 04/18/12 Assistance Needed: +2    Subjective Data  Subjective: Oh that hurts so much Patient Stated Goal: unable to state   Prior Functioning     Home Living Lives With: Other (Comment) (granddaughter) Available Help at Discharge: Skilled Nursing Facility Type of Home: House Home Adaptive Equipment: Walker - rolling Additional Comments: had aide 6 hrs/day Prior Function Level of Independence: Needs assistance Able to Take Stairs?: No Driving: No Vocation: Retired Musician: Other (comment) (difficult to understand at times) Dominant Hand: Right         Vision/Perception     Cognition  Overall Cognitive Status: History of cognitive impairments - at baseline Arousal/Alertness: Lethargic Orientation Level: Disoriented to;Place;Time;Situation Behavior During Session: Lethargic    Extremity/Trunk Assessment Right Upper Extremity Assessment RUE ROM/Strength/Tone: Deficits RUE  ROM/Strength/Tone Deficits: AAROM to 90 for bil ues; grip strength good bilaterally Left Upper Extremity Assessment LUE ROM/Strength/Tone: Deficits LUE  ROM/Strength/Tone Deficits: see rue  Trunk Assessment Trunk Assessment: Kyphotic     Mobility Bed Mobility Bed Mobility: Supine to Sit;Sitting - Scoot to [place] of Bed;Sit to Supine;Rolling Right;Rolling Left Supine to Sit: 1: +2 Total assist;HOB elevated Supine to Sit: Patient Percentage: 0% Sitting - Scoot to Edge of Bed: 1: +2 Total assist Sitting - Scoot to Edge of Bed: Patient Percentage: 0% Sit to Supine: 1: +2 Total assist;HOB flat Sit to Supine: Patient Percentage: 0% Details for Bed Mobility Assistance: Requires total physical assist for LEs and trunk to get into sitting position at EOB.  Rolled several times to assist pt with bed pan placement.  Provided cues for technique, however pt with increased pain and decreased cognition, therefore unable to follow commands.      Shoulder Instructions     Exercise     Balance Balance Balance Assessed: Yes Static Sitting Balance Static Sitting - Balance Support: Left upper extremity supported Static Sitting - Level of Assistance: 5: Stand by assistance;4: Min assist Static Sitting - Comment/# of Minutes: variable amount of assist   End of Session OT - End of Session Activity Tolerance: Patient limited by fatigue;Patient limited by pain Patient left: in bed;with call bell/phone within reach;with family/visitor present  GO     Amber Love 04/18/2012, 1:36 PM Marica Otter, OTR/L [phone] 04/18/2012

## 2012-04-18 NOTE — Progress Notes (Signed)
Called to bedside for hypoTN to SBP 60s systolic, poor mentation, minimal uop.  Patient awake, but unable to answer questions.  Blood pressure consistently in the low 70s SBP with MAPS in the low to mid 50s.   GEN:  Pale caucasian female, no acute distress HEENT:  Dry MMM CV:  Irregularly irregular, no murmurs,  PULM:  Rales at bilateral bases ABD:  Soft MSK:  No excessive swelling or bruising of the left hip  UA c/w UTI and FENa 0.03% ECG with new T-wave inversions and ST segment depressions in I, aVL, and V6  A/P:  Hypotension likely due to hypovolemia (insensible fluid losses versus hemorrhage) versus sepsis versus cardiac.   -  NS 3L over 1 hour -  Lower head of bed -  CMP, CBC, troponin, blood cultures stat -  Escalate to vanc/zosyn -  DC ceftriaxone -  Confirmed that patient would not want pressors, central line, chest compressions, intubation.  DNR/DNI.   -  Will reassess

## 2012-04-18 NOTE — Clinical Social Work Psychosocial (Signed)
Clinical Social Work Department BRIEF PSYCHOSOCIAL ASSESSMENT 04/18/2012  Patient:  JANELLE, SPELLMAN     Account Number:  [id number]     Admit date:  04/14/2012  Clinical Social Worker:  Jodelle Red  Date/Time:  04/18/2012 12:45 PM  Referred by:  Physician  Date Referred:  04/17/2012 Referred for  SNF Placement   Other Referral:   Interview type:  Family Other interview type:   chart review, discussion with RN    PSYCHOSOCIAL DATA Living Status:  FAMILY Admitted from facility:   Level of care:   Primary support name:  April Brame Primary support relationship to patient:  FAMILY Degree of support available:   very good lives with family, has Medical illustrator and Child psychotherapist in place in community    CURRENT CONCERNS Current Concerns  Post-Acute Placement   Other Concerns:    SOCIAL WORK ASSESSMENT / PLAN CSW spoke with granddaughter about SNF. Pt has great care at home, but family aware she may need rehab after this fall. Pt not able to understand this currently due to dementia. CSW provided family with SNF list and they prefer [facility]. CSW will begin SNF search.   Assessment/plan status:  Psychosocial Support/Ongoing Assessment of Needs Other assessment/ plan:   placement at SNF.   Information/referral to community resources:   SNF list, SNF search    PATIENT'S/FAMILY'S RESPONSE TO PLAN OF CARE: Granddaughter agrees to SNF search. CSW to begin looking for SNF.    Doreen Salvage, LCSW ICU/Stepdown Clinical Social Worker [facility] Cell [phone] Hours 8am-1200pm M-F

## 2012-04-18 NOTE — Progress Notes (Signed)
PROGRESS NOTE  Subjective:   Amber Love is an 78 yo admitted after a fall and subsequent left hip fracture.  She was noted to have + Troponin levels and we were consulted for further evaluation.   The echo reveals apical akinesis with EF of 40% - the contractility pattern is c/w Takotsubo cardiomyopathy.   She is pleasantly demented.   Her urine output is low.  I think she is dry.  Objective:    Vital Signs:   Temp:  [97.2 F (36.2 C)-98.8 F (37.1 C)] 98.5 F (36.9 C) (12/04 0400) Pulse Rate:  [28-121] 102  (12/04 0700) Resp:  [13-29] 25  (12/04 0700) BP: (74-131)/(30-107) 95/80 mmHg (12/04 0700) SpO2:  [69 %-100 %] 98 % (12/04 0700) Weight:  [151 lb 10.8 oz (68.8 kg)] 151 lb 10.8 oz (68.8 kg) (12/04 0400)  Last BM Date:  (PTA)   24-hour weight change: Weight change: 2 lb 6.8 oz (1.1 kg)  Weight trends: Filed Weights   04/14/12 2240 04/17/12 0400 04/18/12 0400  Weight: 147 lb 7.8 oz (66.9 kg) 149 lb 4 oz (67.7 kg) 151 lb 10.8 oz (68.8 kg)    Intake/Output:  12/03 0701 - 12/04 0700 In: 2041.7 [P.O.:120; I.V.:1821.7; IV Piggyback:100] Out: 891 [Urine:790; Stool:1; Blood:100]     Physical Exam: BP 95/80  Pulse 102  Temp 98.5 F (36.9 C) (Oral)  Resp 25  Ht 5\' 4"  (1.626 m)  Wt 151 lb 10.8 oz (68.8 kg)  BMI 26.04 kg/m2  SpO2 98%  General: Vital signs reviewed and noted.  Patient feels warm.  Head: Normocephalic, atraumatic.  Eyes: conjunctivae/corneas clear.  EOM's intact.   Throat: normal  Neck: Thin,   Lungs:  clear  Heart: Irreg. Irreg.  2/6 systolic murmur  Abdomen:  Soft, non-tender, non-distended    Extremities: No edema,  Decreased skin turgor in hands  Neurologic: Demented   Psych: Demented, does not know why she is here    Labs: BMET:  Basename 04/18/12 0340 04/17/12 2221 04/17/12 0345  NA 139 142 --  K 3.5 3.4* --  CL 102 103 --  CO2 24 24 --  GLUCOSE 198* 219* --  BUN 26* 22 --  CREATININE 0.98 0.87 --  CALCIUM 8.4 8.6 --  MG --  -- 2.1  PHOS -- -- --    Liver function tests: No results found for this basename: AST:2,ALT:2,ALKPHOS:2,BILITOT:2,PROT:2,ALBUMIN:2 in the last 72 hours No results found for this basename: LIPASE:2,AMYLASE:2 in the last 72 hours  CBC:  Basename 04/18/12 0340 04/17/12 2221  WBC 18.0* 22.4*  NEUTROABS -- --  HGB 9.5* 9.9*  HCT 30.1* 32.1*  MCV 96.2 98.8  PLT 181 235    Cardiac Enzymes:  Basename 04/15/12 0904  CKTOTAL --  CKMB --  TROPONINI 0.60*    Coagulation Studies:  Basename 04/17/12 1622  LABPROT 15.8*  INR 1.29    Other: No components found with this basename: POCBNP:3 No results found for this basename: DDIMER in the last 72 hours No results found for this basename: HGBA1C in the last 72 hours    Tele:  A-fib  Medications:    Infusions:    . [EXPIRED] sodium chloride 100 mL/hr at 04/18/12 0217  . sodium chloride 75 mL/hr at 04/18/12 0801  . [DISCONTINUED] sodium chloride 75 mL/hr at 04/16/12 2315  . [DISCONTINUED] lactated ringers    . [DISCONTINUED] lactated ringers    . [DISCONTINUED] phenylephrine (NEO-SYNEPHRINE) 10 mg/250 ml infusion (40 mcg/ml)  Scheduled Medications:    . antiseptic oral rinse  15 mL Mouth Rinse q12n4p  . aspirin EC  81 mg Oral Daily  . [COMPLETED]  ceFAZolin (ANCEF) IV  2 g Intravenous 60 min Pre-Op  . [COMPLETED]  ceFAZolin (ANCEF) IV  2 g Intravenous Q6H  . chlorhexidine  15 mL Mouth Rinse BID  . docusate sodium  100 mg Oral BID  . escitalopram  10 mg Oral QPM  . levothyroxine  100 mcg Oral QAC breakfast  . lisinopril  2.5 mg Oral BID  . metoprolol  12.5 mg Oral BID  . Rivaroxaban  20 mg Oral q morning - 10a  . sodium chloride  3 mL Intravenous Q12H    Assessment/ Plan:    1. CAD / NSTEMI  The echo shows apical akinesis with moderate LV dysfunction.  The contractility pattern is c/w Takotsubo cardiomyopathy.  Another possibility is that she has had a previous apical MI.  Her Troponin levels are not very  elevated so I do not think that she has had a recent apical MI  She has dementia and is not able to tell us any history.  She is not in shock and her HR and BP are normal this am.  I doubt she is having an MI currently.     She seems to be stable.  Her HR is fast- I think she is volume depleted.  She has not been eating much.  2. Systolic congestive heart failure:  I do not know at this point whether the LV dysfunction is acute or chronic.  It it improves, we will assume this was a Takotsubo cardiomyopathy caused by coronary spasm ( and therefore this would be acute systolic CHF).  If it does not improve, we would be able to say that this is chronic systolic CHF.  This improvement will occur over the next several months.  I do not anticipate seeing  any improvement in her LV function in the short term.   Continue metoprolol 12.5 BID and  Lisinopril 2.5 BID - this will help her LV function improve. No further recs at this time.  3. Atrial fib  She is not a candidate for long term anticoagulation.  I think it is OK to anticoagulate her during this post op period in order to minimize risk for DVT.  She is not going to be walking on her own anyway and her risk or falling is low.  In Dr. Olga Millers notes from the office, he comments that she has fallen numerous times and he would not continue anticoaulation if she fell again.  She did fall and broke her hip which lead to this admission.  I agree that this is a high risk for anticoagulation.  Once she is improved from an orthopedic standpoint, I would DC xarelto.   Currently pacing   Falls (11/07/2011) She has a hx of falls for the past several months.  She is at risk for significant hemorrhage if we continue the anticoagulation but she is also at high risk for recurrent CVA with her atrial fib.  Displaced intertrochanteric fracture of left femur (04/14/2012) Plans per ortho.    Will sign off. No further recs.   Disposition:  Length of Stay:  4  Vesta Mixer, Montez Hageman., MD, [facility] 04/18/2012, 8:09 AM Office [phone] Pager [phone]

## 2012-04-18 NOTE — Progress Notes (Signed)
Subjective: 1 Day Post-Op Procedure(s) (LRB): INTRAMEDULLARY (IM) NAIL FEMORAL (Left) Patient reports pain as pt states she hurts, very confused this am.    Objective: Vital signs in last 24 hours: Temp:  [97.2 F (36.2 C)-98.8 F (37.1 C)] 98.5 F (36.9 C) (12/04 0400) Pulse Rate:  [28-121] 102  (12/04 0700) Resp:  [13-29] 25  (12/04 0700) BP: (74-131)/(30-107) 95/80 mmHg (12/04 0700) SpO2:  [69 %-100 %] 98 % (12/04 0700) Weight:  [68.8 kg (151 lb 10.8 oz)] 68.8 kg (151 lb 10.8 oz) (12/04 0400)  Intake/Output from previous day: 12/03 0701 - 12/04 0700 In: 2041.7 [P.O.:120; I.V.:1821.7; IV Piggyback:100] Out: 891 [Urine:790; Stool:1; Blood:100] Intake/Output this shift:     Basename 04/18/12 0340 04/17/12 2221 04/17/12 1959 04/17/12 0345 04/16/12 0353  HGB 9.5* 9.9* 9.7* 9.5* 9.7*    Basename 04/18/12 0340 04/17/12 2221  WBC 18.0* 22.4*  RBC 3.13* 3.25*  HCT 30.1* 32.1*  PLT 181 235    Basename 04/18/12 0340 04/17/12 2221  NA 139 142  K 3.5 3.4*  CL 102 103  CO2 24 24  BUN 26* 22  CREATININE 0.98 0.87  GLUCOSE 198* 219*  CALCIUM 8.4 8.6    Basename 04/17/12 1622  LABPT --  INR 1.29    moves toes and feet on command. sensation intact  Assessment/Plan: 1 Day Post-Op Procedure(s) (LRB): INTRAMEDULLARY (IM) NAIL FEMORAL (Left) Up with therapy Discharge to SNF when medically stable Ice to thigh and heels off bed.  Austin Herd M 04/18/2012, 8:40 AM

## 2012-04-18 NOTE — Evaluation (Signed)
Clinical/Bedside Swallow Evaluation Patient Details  Name: LACRECIA DELVAL MRN: [id number] Date of Birth: [birth year]  Today's Date: 04/18/2012 Time: 1100-1132 SLP Time Calculation (min): 32 min  Past Medical History:  Past Medical History  Diagnosis Date  . Bladder cancer   . CVA (cerebral infarction)   . Hyperthyroidism   . Bradycardia   . Paroxysmal atrial fibrillation   . Hypertension    Past Surgical History:  Past Surgical History  Procedure Date  . Pacemaker insertion     medtronic   HPI:  HPI: KAYCIE PEGUES is a 78 y.o. female came to [facility] ed 04/14/2012 with H/O mechanical fall presented with a fall.     Assessment / Plan / Recommendation Clinical Impression  Patient presents with similar results as seen on previous eval., with primarily oral phase dysphagia.  Patient denies having a sore throat and voice is clear.  Patient was able to sit up to 85 % without pain, which is remarkably improved from prior to surgery.  Patient will need assist and supervision to follow aspiration precautions, due to cognitive deficits.    Aspiration Risk  None    Diet Recommendation Dysphagia 3 (Mechanical Soft);Thin liquid (Chopped meats)   Liquid Administration via: Straw Medication Administration: Whole meds with liquid Supervision: Staff feed patient;Full supervision/cueing for compensatory strategies Compensations: Slow rate;Small sips/bites;Check for pocketing Postural Changes and/or Swallow Maneuvers: Seated upright 90 degrees    Other  Recommendations Oral Care Recommendations: Oral care QID;Staff/trained caregiver to provide oral care   Follow Up Recommendations  Skilled Nursing facility    Frequency and Duration        Pertinent Vitals/Pain n/a    SLP Swallow Goals     Swallow Study Prior Functional Status       General HPI: HPI: RUE VALLADARES is a 78 y.o. female came to [facility] ed 04/14/2012 with H/O mechanical fall presented with a fall.   Type of Study:  Bedside swallow evaluation Previous Swallow Assessment: 12/3  BSE repeated due to surgery and intubation last lnight. Diet Prior to this Study: Regular Temperature Spikes Noted: No Respiratory Status: Supplemental O2 delivered via (comment) History of Recent Intubation: Yes Length of Intubations (days): 1 days (For surgery) Date extubated: 04/17/12 Behavior/Cognition: Cooperative;Pleasant mood;Lethargic;Confused;Distractible;Requires cueing;Decreased sustained attention Oral Cavity - Dentition: Dentures, top;Dentures, bottom Self-Feeding Abilities: Total assist Patient Positioning: Upright in bed Baseline Vocal Quality: Clear Volitional Cough: Cognitively unable to elicit Volitional Swallow: Unable to elicit    Oral/Motor/Sensory Function Overall Oral Motor/Sensory Function: Impaired (See BSE of 12/4)   Ice Chips Ice chips: Within functional limits Presentation: Spoon   Thin Liquid Thin Liquid: Within functional limits Presentation: Spoon;Cup;Straw    Nectar Thick Nectar Thick Liquid: Not tested   Honey Thick Honey Thick Liquid: Not tested   Puree Puree: Within functional limits Presentation: Spoon   Solid   GO    Solid: Impaired Oral Phase Impairments: Reduced lingual movement/coordination;Poor awareness of bolus Oral Phase Functional Implications: Oral residue;Left lateral sulci pocketing       Vickee Mormino T 04/18/2012,11:44 AM

## 2012-04-18 NOTE — Clinical Social Work Placement (Signed)
Clinical Social Work Department CLINICAL SOCIAL WORK PLACEMENT NOTE 04/18/2012  Patient:  Amber Love, Amber Love  Account Number:  [id number] Admit date:  04/14/2012  Clinical Social Worker:  Jodelle Red  Date/time:  04/18/2012 12:51 PM  Clinical Social Work is seeking post-discharge placement for this patient at the following level of care:   SKILLED NURSING   (*CSW will update this form in Epic as items are completed)   04/18/2012  Patient/family provided with [facility] Department of Clinical Social Work's list of facilities offering this level of care within the geographic area requested by the patient (or if unable, by the patient's family).  04/18/2012  Patient/family informed of their freedom to choose among providers that offer the needed level of care, that participate in Medicare, Medicaid or managed care program needed by the patient, have an available bed and are willing to accept the patient.  04/18/2012  Patient/family informed of [facility]' ownership interest in [facility], as well as of the fact that they are under no obligation to receive care at this facility.  PASARR submitted to EDS on  PASARR number received from EDS on   FL2 transmitted to all facilities in geographic area requested by pt/family on   FL2 transmitted to all facilities within larger geographic area on   Patient informed that his/her managed care company has contracts with or will negotiate with  certain facilities, including the following:     Patient/family informed of bed offers received:   Patient chooses bed at  Physician recommends and patient chooses bed at    Patient to be transferred to  on   Patient to be transferred to facility by   The following physician request were entered in Epic:   Additional Comments: Family prefers [facility]  Doreen Salvage, [place] ICU/Stepdown Clinical Social Worker [facility] Cell [phone] Hours  8am-1200pm M-F

## 2012-04-18 NOTE — Evaluation (Signed)
Physical Therapy Evaluation Patient Details Name: Amber Love MRN: [id number] DOB: April 21, 1921 Today's Date: 04/18/2012 Time: 1191-4782 PT Time Calculation (min): 32 min  PT Assessment / Plan / Recommendation Clinical Impression  Pt presents with L femur fracture s/p IM nail with history of CVA, afib and severe dementia.  Tolerated rolling and sitting EOB, however requires +2 assist for all bed mobility.  Pt will benefit from skilled PT in acute venue to address deficits.  PT recommends SNF for follow up at D/C to maximize pts safety.      PT Assessment  Patient needs continued PT services    Follow Up Recommendations  SNF;Supervision/Assistance - 24 hour    Does the patient have the potential to tolerate intense rehabilitation      Barriers to Discharge None      Equipment Recommendations  None recommended by PT    Recommendations for Other Services     Frequency Min 3X/week    Precautions / Restrictions Precautions Precautions: Fall Restrictions Weight Bearing Restrictions: Yes LLE Weight Bearing: Weight bearing as tolerated Other Position/Activity Restrictions: WBAT for transfers and 50% PWB if ambulates.    Pertinent Vitals/Pain 6/10 per PAINAD      Mobility  Bed Mobility Bed Mobility: Supine to Sit;Sitting - Scoot to [place] of Bed;Sit to Supine;Rolling Right;Rolling Left Supine to Sit: 1: +2 Total assist;HOB elevated Supine to Sit: Patient Percentage: 0% Sitting - Scoot to Edge of Bed: 1: +2 Total assist Sitting - Scoot to Edge of Bed: Patient Percentage: 0% Sit to Supine: 1: +2 Total assist;HOB flat Sit to Supine: Patient Percentage: 0% Details for Bed Mobility Assistance: Requires total physical assist for LEs and trunk to get into sitting position at EOB.  Rolled several times to assist pt with bed pan placement.  Provided cues for technique, however pt with increased pain and decreased cognition, therefore unable to follow commands.  Transfers Transfers:  Not assessed Ambulation/Gait Ambulation/Gait Assistance: Not tested (comment) Stairs: No Wheelchair Mobility Wheelchair Mobility: No    Shoulder Instructions     Exercises     PT Diagnosis: Difficulty walking;Generalized weakness;Acute pain  PT Problem List: Decreased strength;Decreased range of motion;Decreased activity tolerance;Decreased balance;Decreased mobility;Decreased coordination;Decreased cognition;Decreased knowledge of use of DME;Decreased safety awareness;Decreased knowledge of precautions;Pain PT Treatment Interventions: DME instruction;Gait training;Functional mobility training;Therapeutic activities;Balance training;Therapeutic exercise;Patient/family education   PT Goals Acute Rehab PT Goals PT Goal Formulation: With family Time For Goal Achievement: 05/02/12 Potential to Achieve Goals: Fair Pt will go Supine/Side to Sit: with mod assist PT Goal: Supine/Side to Sit - Progress: Goal set today Pt will go Sit to Supine/Side: with mod assist PT Goal: Sit to Supine/Side - Progress: Goal set today Pt will go Sit to Stand: with max assist PT Goal: Sit to Stand - Progress: Goal set today Pt will go Stand to Sit: with max assist PT Goal: Stand to Sit - Progress: Goal set today Pt will Transfer Bed to Chair/Chair to Bed: with +2 total assist;Other (comment) (pt assist 40%) PT Transfer Goal: Bed to Chair/Chair to Bed - Progress: Goal set today  Visit Information  Last PT Received On: 04/18/12 Assistance Needed: +2 PT/OT Co-Evaluation/Treatment: Yes    Subjective Data  Subjective: I need to pee Patient Stated Goal: n/a   Prior Functioning  Home Living Lives With: Daughter Available Help at Discharge: Skilled Nursing Facility Type of Home: House Home Adaptive Equipment: Walker - rolling Prior Function Level of Independence: Needs assistance Able to Take Stairs?: No Driving: No  Vocation: Retired    Probation officer Status: History of cognitive  impairments - at baseline Arousal/Alertness: Lethargic Orientation Level: Disoriented to;Place;Time;Situation Behavior During Session: Lethargic    Extremity/Trunk Assessment Right Lower Extremity Assessment RLE ROM/Strength/Tone: Unable to fully assess;Due to impaired cognition Left Lower Extremity Assessment LLE ROM/Strength/Tone: Unable to fully assess;Due to pain;Due to impaired cognition Trunk Assessment Trunk Assessment: Kyphotic   Balance Balance Balance Assessed: Yes Static Sitting Balance Static Sitting - Balance Support: Bilateral upper extremity supported Static Sitting - Level of Assistance: 5: Stand by assistance;4: Min assist;2: Max assist Static Sitting - Comment/# of Minutes: Varying levels of support required to sit EOB.  Did well once into sitting, however required increased assist again when fatiguing.  BP stable while in sitting.    End of Session PT - End of Session Equipment Utilized During Treatment: Oxygen Activity Tolerance: Patient limited by fatigue;Patient limited by pain Patient left: in bed;with call bell/phone within reach;with family/visitor present Nurse Communication: Mobility status  GP     Page, Meribeth Mattes 04/18/2012, 12:46 PM

## 2012-04-18 NOTE — Progress Notes (Addendum)
Patient's blood pressure recovered initially, but then dropped again to the 70s systolic.  Started another bolus of NS.  Troponin continues to trend down.     Notified around 5:15pm that patient had desaturated to 50% without good waveform.  She was placed on nonrebreather and after better waveform obtained, O2 saturations on the forehead were in the mid-80s systolic.    -  ABG:  7.34/34/235 on NRB >> changed to nasal canula and O2 sats in high 90s -  CXR demonstrates pulmonary edema and cardiomegaly -  UOP still minimal -  IVF at 33ml/h for now >> MAPs in 60s to 70s currently but may drop now that IVF decreased

## 2012-04-18 NOTE — Progress Notes (Signed)
ANTIBIOTIC CONSULT NOTE - INITIAL  Pharmacy Consult for vancomycin/zosyn Indication: ? Early sepsis  No Known Allergies  Patient Measurements: Height: 5\' 4"  (162.6 cm) Weight: 151 lb 10.8 oz (68.8 kg) IBW/kg (Calculated) : 54.7  Adjusted Body Weight:   Vital Signs: Temp: 98.2 F (36.8 C) (12/04 1200) Temp src: Oral (12/04 1200) BP: 104/58 mmHg (12/04 1100) Pulse Rate: 101  (12/04 1100) Intake/Output from previous day: 12/03 0701 - 12/04 0700 In: 2041.7 [P.O.:120; I.V.:1821.7; IV Piggyback:100] Out: 891 [Urine:790; Stool:1; Blood:100] Intake/Output from this shift: Total I/O In: 350 [I.V.:300; IV Piggyback:50] Out: 48 [Urine:47; Stool:1]  Labs:  Basename 04/18/12 0820 04/18/12 0340 04/17/12 2221 04/17/12 1959 04/17/12 0345  WBC -- 18.0* 22.4* -- 12.6*  HGB -- 9.5* 9.9* 9.7* --  PLT -- 181 235 -- 142*  LABCREA 206.1 -- -- -- --  CREATININE -- 0.98 0.87 -- 0.77   Estimated Creatinine Clearance: 37 ml/min (by C-G formula based on Cr of 0.98). No results found for this basename: VANCOTROUGH:2,VANCOPEAK:2,VANCORANDOM:2,GENTTROUGH:2,GENTPEAK:2,GENTRANDOM:2,TOBRATROUGH:2,TOBRAPEAK:2,TOBRARND:2,AMIKACINPEAK:2,AMIKACINTROU:2,AMIKACIN:2, in the last 72 hours   Microbiology: Recent Results (from the past 720 hour(s))  MRSA PCR SCREENING     Status: Normal   Collection Time   04/14/12  9:06 PM      Component Value Range Status Comment   MRSA by PCR NEGATIVE  NEGATIVE Final     Medical History: Past Medical History  Diagnosis Date  . Bladder cancer   . CVA (cerebral infarction)   . Hyperthyroidism   . Bradycardia   . Paroxysmal atrial fibrillation   . Hypertension     Medications:  Scheduled:    . antiseptic oral rinse  15 mL Mouth Rinse q12n4p  . aspirin EC  81 mg Oral Daily  . [COMPLETED]  ceFAZolin (ANCEF) IV  2 g Intravenous 60 min Pre-Op  . [COMPLETED]  ceFAZolin (ANCEF) IV  2 g Intravenous Q6H  . chlorhexidine  15 mL Mouth Rinse BID  . docusate sodium  100  mg Oral BID  . escitalopram  10 mg Oral QPM  . levothyroxine  100 mcg Oral QAC breakfast  . rivaroxaban  10 mg Oral Daily  . sodium chloride  2,000 mL Intravenous Once  . sodium chloride  500 mL Intravenous Once  . sodium chloride  500 mL Intravenous Once  . sodium chloride  3 mL Intravenous Q12H  . [DISCONTINUED] cefTRIAXone (ROCEPHIN)  IV  1 g Intravenous Q24H  . [DISCONTINUED] lisinopril  2.5 mg Oral BID  . [DISCONTINUED] metoprolol  12.5 mg Oral BID  . [DISCONTINUED] Rivaroxaban  20 mg Oral q morning - 10a   Assessment: 89 YOF s/p fall and hip fx surgery.  Orders to start broad spectrum abx for question of sepsis with hypotension.  12/4 >>zosyn  >> 12/4 >> vancomycin   >>    Tmax: 99.1 WBCs: 18 (? Reactive from stress of OR) Renal: SCr = 0.98. CG CrCl=37, N crCl = 44  12/4  Blood: ordered 12/4 urine: ordered  Dose changes/drug level info: none to day  Goal of Therapy:  Vancomycin trough level 15-20 mcg/ml  Plan:  - Zosyn 3.375gm IV q8h each dose over 4h infusion - vancomycin 1gm iv x 1 now then 1gm IV q24h starting 8am 12/5 - follow renal function and cultures, check Css trough if remains on vancomycin  Dannielle Huh 04/18/2012,1:55 PM

## 2012-04-19 ENCOUNTER — Encounter (HOSPITAL_COMMUNITY): Payer: Self-pay | Admitting: Specialist

## 2012-04-19 DIAGNOSIS — J9600 Acute respiratory failure, unspecified whether with hypoxia or hypercapnia: Secondary | ICD-10-CM

## 2012-04-19 DIAGNOSIS — S72143A Displaced intertrochanteric fracture of unspecified femur, initial encounter for closed fracture: Principal | ICD-10-CM

## 2012-04-19 DIAGNOSIS — W19XXXA Unspecified fall, initial encounter: Secondary | ICD-10-CM

## 2012-04-19 LAB — BASIC METABOLIC PANEL
BUN: 24 mg/dL — ABNORMAL HIGH (ref 6–23)
CO2: 19 mEq/L (ref 19–32)
Calcium: 5.3 mg/dL — CL (ref 8.4–10.5)
Calcium: 8.6 mg/dL (ref 8.4–10.5)
Creatinine, Ser: 0.71 mg/dL (ref 0.50–1.10)
Creatinine, Ser: 0.85 mg/dL (ref 0.50–1.10)
GFR calc non Af Amer: 74 mL/min — ABNORMAL LOW (ref >90)
Glucose, Bld: 95 mg/dL (ref 70–99)
Glucose, Bld: 143 mg/dL — ABNORMAL HIGH (ref 70–99)

## 2012-04-19 LAB — CBC
HCT: 20.8 % — ABNORMAL LOW (ref 36.0–46.0)
Hemoglobin: 6.8 g/dL — CL (ref 12.0–15.0)
MCH: 31.2 pg (ref 26.0–34.0)
MCH: 31.1 pg (ref 26.0–34.0)
MCHC: 32.7 g/dL (ref 30.0–36.0)
MCV: 91 fL (ref 78.0–100.0)
Platelets: 216 10*3/uL (ref 150–400)
RDW: 13.6 % (ref 11.5–15.5)
WBC: 18.2 10*3/uL — ABNORMAL HIGH (ref 4.0–10.5)

## 2012-04-19 LAB — GLUCOSE, CAPILLARY: Glucose-Capillary: 125 mg/dL — ABNORMAL HIGH (ref 70–99)

## 2012-04-19 LAB — URINE CULTURE: Culture: NO GROWTH

## 2012-04-19 MED ORDER — POTASSIUM CHLORIDE CRYS ER 20 MEQ PO TBCR
40.0000 meq | EXTENDED_RELEASE_TABLET | Freq: Once | ORAL | Status: AC
  Administered 2012-04-19: 40 meq via ORAL
  Filled 2012-04-19: qty 2

## 2012-04-19 MED ORDER — SODIUM CHLORIDE 0.9 % IV SOLN
2.0000 g | Freq: Once | INTRAVENOUS | Status: AC
  Administered 2012-04-19: 2 g via INTRAVENOUS
  Filled 2012-04-19: qty 20

## 2012-04-19 MED ORDER — SODIUM BICARBONATE 8.4 % IV SOLN
INTRAVENOUS | Status: DC
  Administered 2012-04-19: 23:00:00 via INTRAVENOUS
  Filled 2012-04-19 (×2): qty 50

## 2012-04-19 MED ORDER — SODIUM CHLORIDE 0.9 % IV SOLN
4.0000 g | Freq: Once | INTRAVENOUS | Status: DC
  Filled 2012-04-19: qty 40

## 2012-04-19 MED ORDER — POTASSIUM CHLORIDE 10 MEQ/100ML IV SOLN
10.0000 meq | INTRAVENOUS | Status: AC
  Administered 2012-04-19 (×4): 10 meq via INTRAVENOUS
  Filled 2012-04-19: qty 200
  Filled 2012-04-19 (×2): qty 100

## 2012-04-19 MED ORDER — LACTATED RINGERS IV SOLN
INTRAVENOUS | Status: DC
  Administered 2012-04-19: 50 mL/h via INTRAVENOUS

## 2012-04-19 NOTE — Progress Notes (Signed)
PROGRESS NOTE  Subjective:   Amber Love is an 78 yo admitted after a fall and subsequent left hip fracture.  She was noted to have + Troponin levels and we were consulted for further evaluation.   The echo reveals apical akinesis with EF of 40% - the contractility pattern is c/w Takotsubo cardiomyopathy.   She is pleasantly demented.   We had signed off yesterday but were called back to evaluate the patient for profound hypotension yesterday afternoon.  Labs reveal significant anemia ( likely due to surgery ), metabolic acidosis, and  Hypokalemia.  Her cardiac enzymes have trended down.  She is ordered to have transfusions.  Her BP increased with IVF. Objective:    Vital Signs:   Temp:  [97.8 F (36.6 C)-99.1 F (37.3 C)] 98.7 F (37.1 C) (12/05 0400) Pulse Rate:  [57-109] 101  (12/05 0600) Resp:  [17-28] 20  (12/05 0600) BP: (69-126)/(34-73) 85/70 mmHg (12/05 0600) SpO2:  [81 %-100 %] 97 % (12/05 0600)  Last BM Date: 04/18/12   24-hour weight change: Weight change:   Weight trends: Filed Weights   04/14/12 2240 04/17/12 0400 04/18/12 0400  Weight: 147 lb 7.8 oz (66.9 kg) 149 lb 4 oz (67.7 kg) 151 lb 10.8 oz (68.8 kg)    Intake/Output:  12/04 0701 - 12/05 0700 In: 4837.5 [I.V.:850; IV Piggyback:3987.5] Out: 250 [Urine:247; Stool:3]     Physical Exam: BP 85/70  Pulse 101  Temp 98.7 F (37.1 C) (Axillary)  Resp 20  Ht 5\' 4"  (1.626 m)  Wt 151 lb 10.8 oz (68.8 kg)  BMI 26.04 kg/m2  SpO2 97%  General: Vital signs reviewed and noted.  Patient feels warm.  Head: Normocephalic, atraumatic.  Eyes: conjunctivae/corneas clear.  EOM's intact.   Throat: normal  Neck: Thin,   Lungs:  clear  Heart: Irreg. Irreg.  2/6 systolic murmur  Abdomen:  Soft, non-tender, non-distended    Extremities: No edema,  Decreased skin turgor in hands  Neurologic: Demented   Psych: Demented, does not know why she is here    Labs: BMET:  Basename 04/19/12 0337 04/18/12 1457  04/17/12 0345  NA 145 143 --  K 2.7* 3.8 --  CL 120* 108 --  CO2 14* 22 --  GLUCOSE 95 157* --  BUN 24* 32* --  CREATININE 0.71 1.18* --  CALCIUM 5.3* 7.7* --  MG -- 2.0 2.1  PHOS 1.9* 2.9 --    Liver function tests:  [facility] 04/18/12 1457  AST 301*  ALT 134*  ALKPHOS 61  BILITOT 0.7  PROT 5.3*  ALBUMIN 2.0*   No results found for this basename: LIPASE:2,AMYLASE:2 in the last 72 hours  CBC:  Basename 04/19/12 0337 04/18/12 1457  WBC 11.3* 11.9*  NEUTROABS -- 9.4*  HGB 6.8* 8.8*  HCT 20.8* 26.9*  MCV 95.4 94.7  PLT 148* 141*    Cardiac Enzymes:  Basename 04/18/12 1457  CKTOTAL --  CKMB --  TROPONINI 0.34*    Coagulation Studies:  Basename 04/17/12 1622  LABPROT 15.8*  INR 1.29    Other: No components found with this basename: POCBNP:3 No results found for this basename: DDIMER in the last 72 hours No results found for this basename: HGBA1C in the last 72 hours    Tele:  A-fib, rapid rate  Medications:    Infusions:    . [EXPIRED] sodium chloride 100 mL/hr at 04/18/12 0217  . sodium chloride 50 mL/hr at 04/19/12 0600  . [DISCONTINUED] sodium chloride 75  mL/hr at 04/18/12 0801  . [DISCONTINUED] sodium chloride      Scheduled Medications:    . antiseptic oral rinse  15 mL Mouth Rinse q12n4p  . aspirin EC  81 mg Oral Daily  . chlorhexidine  15 mL Mouth Rinse BID  . docusate sodium  100 mg Oral BID  . escitalopram  10 mg Oral QPM  . levothyroxine  100 mcg Oral QAC breakfast  . piperacillin-tazobactam (ZOSYN)  IV  3.375 g Intravenous Q8H  . potassium chloride  10 mEq Intravenous Q1 Hr x 4  . rivaroxaban  10 mg Oral Daily  . [COMPLETED] sodium chloride  2,000 mL Intravenous Once  . [COMPLETED] sodium chloride  2,000 mL Intravenous Once  . [COMPLETED] sodium chloride  500 mL Intravenous Once  . [COMPLETED] sodium chloride  500 mL Intravenous Once  . sodium chloride  3 mL Intravenous Q12H  . vancomycin  1,000 mg Intravenous Q24H  .  [COMPLETED] vancomycin  1,000 mg Intravenous Once  . [DISCONTINUED] cefTRIAXone (ROCEPHIN)  IV  1 g Intravenous Q24H  . [DISCONTINUED] lisinopril  2.5 mg Oral BID  . [DISCONTINUED] metoprolol  12.5 mg Oral BID  . [DISCONTINUED] Rivaroxaban  20 mg Oral q morning - 10a    Assessment/ Plan:    1. CAD / NSTEMI  The echo shows apical akinesis with moderate LV dysfunction.  The contractility pattern is c/w Takotsubo cardiomyopathy.  Another possibility is that she has had a previous apical MI.  Her Troponin levels are not very elevated so I do not think that she has had a recent apical MI  She had hypotension yesterday due to hypovolemia.  She is very anemic- she is tender in her leg and may have bleed into her surgical site.  I do not think this hypotension is due to a cardiac etiology.    2. Systolic congestive heart failure:  I do not know at this point whether the LV dysfunction is acute or chronic.  It it improves, we will assume this was a Takotsubo cardiomyopathy caused by coronary spasm ( and therefore this would be acute systolic CHF).  If it does not improve, we would be able to say that this is chronic systolic CHF.  This improvement will occur over the next several months.  I do not anticipate seeing  any improvement in her LV function in the short term.   Continue metoprolol 12.5 BID and  Lisinopril 2.5 BID - this will help her LV function improve. No further recs at this time.  3. Atrial fib  She is not a candidate for long term anticoagulation.  Given the recent fall of her hemoglobin, she may not be a candidate for short term anticoagulation.  Will leave this up to Int. Med and ortho.  Currently pacing   Falls (11/07/2011) She has a hx of falls for the past several months.  She is at risk for significant hemorrhage if we continue the anticoagulation but she is also at high risk for recurrent CVA with her atrial fib.  Displaced intertrochanteric fracture of left femur  (04/14/2012) Plans per ortho.    Will sign off. No further recs.   Disposition:  Length of Stay: 5  Vesta Mixer, Montez Hageman., MD, [facility], [facility], [facility] 04/19/2012, 7:49 AM Office [phone] Pager [phone]

## 2012-04-19 NOTE — Progress Notes (Signed)
Subjective: 2 Days Post-Op Procedure(s) (LRB): INTRAMEDULLARY (IM) NAIL FEMORAL (Left) Patient reports pain as mild. has very little rest but severe pain when she moves her leg. Will need to keep heels off bed since she cannot move her leg on her own   More alert today, but still confused. Objective: Vital signs in last 24 hours: Temp:  [97.1 F (36.2 C)-98.7 F (37.1 C)] 97.7 F (36.5 C) (12/05 1000) Pulse Rate:  [37-101] 73  (12/05 1000) Resp:  [17-29] 25  (12/05 1000) BP: (69-126)/(34-79) 99/67 mmHg (12/05 1000) SpO2:  [81 %-100 %] 96 % (12/05 1000)  Intake/Output from previous day: 12/04 0701 - 12/05 0700 In: 4837.5 [I.V.:850; IV Piggyback:3987.5] Out: 250 [Urine:247; Stool:3] Intake/Output this shift: Total I/O In: 262.5 [I.V.:250; Blood:12.5] Out: 30 [Urine:30]   Basename 04/19/12 0337 04/18/12 1457 04/18/12 0340 04/17/12 2221 04/17/12 1959  HGB 6.8* 8.8* 9.5* 9.9* 9.7*    Basename 04/19/12 0337 04/18/12 1457  WBC 11.3* 11.9*  RBC 2.18* 2.84*  HCT 20.8* 26.9*  PLT 148* 141*    Basename 04/19/12 0337 04/18/12 1457  NA 145 143  K 2.7* 3.8  CL 120* 108  CO2 14* 22  BUN 24* 32*  CREATININE 0.71 1.18*  GLUCOSE 95 157*  CALCIUM 5.3* 7.7*    Basename 04/17/12 1622  LABPT --  INR 1.29    moves her toes and ankle without pain on command. wounds without drainage.  mild edema of thigh  Assessment/Plan: 2 Days Post-Op Procedure(s) (LRB): INTRAMEDULLARY (IM) NAIL FEMORAL (Left) Up with therapy when medically possible, at least to the chair. Continue with ice to thigh.  Dressing changes as needed.  Chirstopher Iovino M 04/19/2012, 10:42 AM

## 2012-04-19 NOTE — Progress Notes (Signed)
Amber Love hip surgery went well and I expect she can be mobilized. Fixation of the fracture was Stable with good bone purchase of the implants. She should be mobilized to improve pulmonary function and prevent decubiti as soon as her medical status permits. Patient examined and lab reviewed with Dondra Spry.

## 2012-04-19 NOTE — Progress Notes (Addendum)
[facility] PROGRESS NOTE  Amber Love:[id number] DOB: 07-23-1920 DOA: 04/14/2012 PCP: Josue Hector, MD  Assessment/Plan:  Hypotension:  LIkely combination of hypovolemia, anemia, and metabolic derangement.  Also consider sepsis. -  Continue IVF, change to LR -  Continue ABX -  Reverse anemia -  Patient would not want CVC or pressors -  Hold BP medications  Leukocytosis, stable and patient afebrile. Tachypneic and evidence of UTI on previous UA.   -  Continue vancomycin and zosyn pending results of blood culture and urine culture -  Urine culture NGTD Acute anemia:  May be from post-surgical blood loss versus dilutional from IVF -  Transfusion of PRBC currently  -  Repeat CBC post transfusion Thrombocytopenia:  Likely due to recent surgery, IVF, acute illness. Mild.  Trend.    Displaced intertrochanteric left femur fx POD 2 s/p intramedullar nail -  Appreciate orthopedic assistance -  Partial weight bearing -  Continue xarelto  Metabolic acidosis, nongap:  Likely from saline infusion given elevated chloride and will resolve once fluids decreased.  May also be from ATN and RTA or lactic acidosis.   -  BMP and lactic acid after blood transfusion complete Acute kidney injury:  Oliguric yesterday but creatinine trending down.   -  If uop does not improve today, nephrology consultation Hypokalemia and hypocalcemia:  Likely due to IVF without calcium and potassium -  Replete and recheck this morning  NSTEMI:  The highest troponin of 0.88 but has trended down to 0.3 yesterday. 2D echo was done 04/15/2012 and showed ejection fraction 40-45%. -  Continue aspirin, lisinopril and metoprolol PAROXYSMAL ATRIAL FIBRILLATION with persistent tachycardia.  Not a candidate for anticoagulation due to frequent falls.   -  Hold metoprolol for hypotension.  Xarelto is for DVT prophylaxis post operatively.   Acute on Chronic systolic heart failure  Based on 2-D echo done  04/15/2012 with ejection fraction 40-45%  Continue management per cardiology with metoprolol and lisinopril when bp tolerates HYPOTHYROIDISM, stable. Continue levothyroxine 100 mg daily  DIET: Healthy haert  ACCESS: PIV  IVF: NS at 40ml/h  PROPH: Xarelto, SCDs   Code Status: DNR  Family Communication: Patient alone, family not at bedside  Disposition Plan: likely SNF placement pending stable blood pressure and improvement in urine output   Consultants:  Cardiology  Orthopedics  Other consultants:  SLP evaluation  Physical therapy  Social work for skilled nursing facility placement Procedures:  Intramedullary nailing of left femur 12/3  Antibiotics:  Cefazolin 12/3 Vanc & zosyn 12/4 >>  HPI/Subjective:  Has pain in the left leg.  Slightly increased uop this morning.    Objective: Filed Vitals:   04/19/12 0420 04/19/12 0500 04/19/12 0600 04/19/12 0800  BP: 97/48 108/72 85/70 86/50   Pulse: 95 68 101   Temp:    97.9 F (36.6 C)  TempSrc:    Oral  Resp: 19 20 20 21   Height:      Weight:      SpO2: 91% 87% 97% 93%    Intake/Output Summary (Last 24 hours) at 04/19/12 0848 Last data filed at 04/19/12 0820  Gross per 24 hour  Intake 5012.5 ml  Output    258 ml  Net 4754.5 ml   Filed Weights   04/14/12 2240 04/17/12 0400 04/18/12 0400  Weight: 66.9 kg (147 lb 7.8 oz) 67.7 kg (149 lb 4 oz) 68.8 kg (151 lb 10.8 oz)    Exam:  General: Caucasian female, no acute distress, sitting up  in bed, more alert Cardiovascular: Irregularly irregular, tachycardic, 2/6 murmur at apex, no rubs or gallops  Respiratory: Rales to mid back bilaterally Abdomen: Soft, diffusely tender without rebound or guarding, improved from yesterday. No organomegaly  Extremities: cool, pulses DP and PT palpable bilaterally.  Arms with trace edema.  No LEE Neuro: Grossly nonfocal  Psych: Alert and oriented to person only.   Data Reviewed: Basic Metabolic Panel:  Lab 04/19/12 1610 04/18/12 1457  04/18/12 0340 04/17/12 2221 04/17/12 0345  NA 145 143 139 142 140  K 2.7* 3.8 3.5 3.4* 3.5  CL 120* 108 102 103 105  CO2 14* 22 24 24 28   GLUCOSE 95 157* 198* 219* 144*  BUN 24* 32* 26* 22 20  CREATININE 0.71 1.18* 0.98 0.87 0.77  CALCIUM 5.3* 7.7* 8.4 8.6 8.4  MG -- 2.0 -- -- 2.1  PHOS 1.9* 2.9 -- -- --   Liver Function Tests:  Lab 04/18/12 1457 04/15/12 0343 04/14/12 1945  AST 301* 20 18  ALT 134* 11 11  ALKPHOS 61 83 85  BILITOT 0.7 0.4 0.5  PROT 5.3* 6.6 6.7  ALBUMIN 2.0* 3.0* 3.1*   No results found for this basename: LIPASE:5,AMYLASE:5 in the last 168 hours No results found for this basename: AMMONIA:5 in the last 168 hours CBC:  Lab 04/19/12 0337 04/18/12 1457 04/18/12 0340 04/17/12 2221 04/17/12 1959 04/17/12 0345 04/14/12 1718  WBC 11.3* 11.9* 18.0* 22.4* -- 12.6* --  NEUTROABS -- 9.4* -- -- -- -- 7.8*  HGB 6.8* 8.8* 9.5* 9.9* 9.7* -- --  HCT 20.8* 26.9* 30.1* 32.1* 30.4* -- --  MCV 95.4 94.7 96.2 98.8 -- 96.5 --  PLT 148* 141* 181 235 -- 142* --   Cardiac Enzymes:  Lab 04/18/12 1457 04/15/12 0904 04/15/12 0343 04/14/12 1955 04/14/12 1945  CKTOTAL -- -- -- 49 --  CKMB -- -- -- 3.0 --  CKMBINDEX -- -- -- -- --  TROPONINI 0.34* 0.60* 0.88* -- 0.46*   BNP (last 3 results) No results found for this basename: PROBNP:3 in the last 8760 hours CBG:  Lab 04/19/12 0732 04/18/12 0742 04/17/12 0747 04/16/12 0842 04/15/12 1646  GLUCAP 125* 193* 128* 130* 135*    Recent Results (from the past 240 hour(s))  MRSA PCR SCREENING     Status: Normal   Collection Time   04/14/12  9:06 PM      Component Value Range Status Comment   MRSA by PCR NEGATIVE  NEGATIVE Final   URINE CULTURE     Status: Normal   Collection Time   04/18/12  8:20 AM      Component Value Range Status Comment   Specimen Description URINE, CLEAN CATCH   Final    Special Requests NONE   Final    Culture  Setup Time 04/18/2012 14:14   Final    Colony Count NO GROWTH   Final    Culture NO GROWTH    Final    Report Status 04/19/2012 FINAL   Final   CULTURE, BLOOD (ROUTINE X 2)     Status: Normal (Preliminary result)   Collection Time   04/18/12  2:55 PM      Component Value Range Status Comment   Specimen Description BLOOD RIGHT ARM   Final    Special Requests BOTTLES DRAWN AEROBIC ONLY [facility]   Final    Culture  Setup Time 04/18/2012 21:59   Final    Culture     Final    Value:  BLOOD CULTURE RECEIVED NO GROWTH TO DATE CULTURE WILL BE HELD FOR 5 DAYS BEFORE ISSUING A FINAL NEGATIVE REPORT   Report Status PENDING   Incomplete   CULTURE, BLOOD (ROUTINE X 2)     Status: Normal (Preliminary result)   Collection Time   04/18/12  3:01 PM      Component Value Range Status Comment   Specimen Description BLOOD RIGHT HAND   Final    Special Requests BOTTLES DRAWN AEROBIC ONLY 3CC   Final    Culture  Setup Time 04/18/2012 21:59   Final    Culture     Final    Value:        BLOOD CULTURE RECEIVED NO GROWTH TO DATE CULTURE WILL BE HELD FOR 5 DAYS BEFORE ISSUING A FINAL NEGATIVE REPORT   Report Status PENDING   Incomplete      Studies: Dg Femur Left  04/17/2012  *RADIOLOGY REPORT*  Clinical Data: Left femoral ORIF.  LEFT FEMUR - 2 VIEW  Comparison: Left hip radiographs 04/14/2012.  Findings: Four spot fluoroscopic images demonstrate the placement of a left femoral intramedullary nail with interlocking dynamic screws within the femoral neck.  The intertrochanteric fracture demonstrates near anatomic alignment.  There are two distal interlocking screws.  No complications are identified.  IMPRESSION: Near anatomic reduction of left femoral intertrochanteric fracture status post ORIF.   Original Report Authenticated By: Carey Bullocks, M.D.    Dg Pelvis Portable  04/18/2012  *RADIOLOGY REPORT*  Clinical Data: Status post internal fixation of left hip fracture.  PORTABLE PELVIS  Comparison: Intraoperative films performed earlier today at 06:44 p.m., and left hip radiographs performed 04/14/2012   Findings: There has been placement of an intramedullary rod within the proximal left femur, with associated screws through the patient's left femoral intertrochanteric fracture, transfixing the fracture in grossly anatomic alignment.  The left femoral head remains seated at the acetabulum.  The right hip joint is grossly unremarkable in appearance.  Mild degenerative change is noted at the lower lumbar spine.  The sacroiliac joints are within normal limits.  Scattered soft tissue air is noted overlying the surgical site. The visualized bowel gas pattern is grossly unremarkable.  IMPRESSION: Status post placement of intramedullary rod within the proximal left femur, with screws transfixing the left femoral intertrochanteric fracture in grossly anatomic alignment.   Original Report Authenticated By: Tonia Ghent, M.D.    Dg Chest Port 1 View  04/18/2012  *RADIOLOGY REPORT*  Clinical Data: Hypoxia  PORTABLE CHEST - 1 VIEW  Comparison: Earlier today  Findings: Left chest wall pacer device is noted with lead in the right atrial appendage and right ventricle.  There is moderate cardiac enlargement.  Interstitial edema has increased from previous exam.  IMPRESSION:  1.  Progression of interstitial edema.   Original Report Authenticated By: Signa Kell, M.D.    Dg Chest Port 1 View  04/18/2012  *RADIOLOGY REPORT*  Clinical Data: Leukocytosis.  Shortness of breath.  PORTABLE CHEST - 1 VIEW  Comparison: 11/30 and 07/31/2011  Findings: There is a new faint infiltrate in the right mid lower lung zone.  There is chronic cardiomegaly.  The pulmonary vascularity remains normal.  No effusions.  Dual lead pacer in place.  Tiny area of atelectasis at the left lung base laterally. There are also a few air bronchograms at the inferior aspect of the left hilum, probably due to atelectasis.  IMPRESSION: New faint infiltrate in the right mid and lower lung zone.  New atelectasis  at the left lung base.   Original Report  Authenticated By: Francene Boyers, M.D.    Dg C-arm 1-60 Min-no Report  04/17/2012  CLINICAL DATA: IM Nail left   C-ARM 1-60 MINUTES  Fluoroscopy was utilized by the requesting physician.  No radiographic  interpretation.      Scheduled Meds:   . antiseptic oral rinse  15 mL Mouth Rinse q12n4p  . aspirin EC  81 mg Oral Daily  . chlorhexidine  15 mL Mouth Rinse BID  . docusate sodium  100 mg Oral BID  . escitalopram  10 mg Oral QPM  . levothyroxine  100 mcg Oral QAC breakfast  . piperacillin-tazobactam (ZOSYN)  IV  3.375 g Intravenous Q8H  . potassium chloride  10 mEq Intravenous Q1 Hr x 4  . rivaroxaban  10 mg Oral Daily  . [COMPLETED] sodium chloride  2,000 mL Intravenous Once  . [COMPLETED] sodium chloride  2,000 mL Intravenous Once  . [COMPLETED] sodium chloride  500 mL Intravenous Once  . [COMPLETED] sodium chloride  500 mL Intravenous Once  . sodium chloride  3 mL Intravenous Q12H  . vancomycin  1,000 mg Intravenous Q24H  . [COMPLETED] vancomycin  1,000 mg Intravenous Once  . [DISCONTINUED] cefTRIAXone (ROCEPHIN)  IV  1 g Intravenous Q24H  . [DISCONTINUED] lisinopril  2.5 mg Oral BID  . [DISCONTINUED] metoprolol  12.5 mg Oral BID  . [DISCONTINUED] Rivaroxaban  20 mg Oral q morning - 10a   Continuous Infusions:   . sodium chloride 50 mL/hr at 04/19/12 0600  . [DISCONTINUED] sodium chloride 75 mL/hr at 04/18/12 0801  . [DISCONTINUED] sodium chloride      Principal Problem:  *Displaced intertrochanteric fracture of left femur Active Problems:  HYPOTHYROIDISM  HYPERTENSION  PAROXYSMAL ATRIAL FIBRILLATION  Chronic diastolic heart failure  Pacemaker  Falls  NSTEMI (non-ST elevated myocardial infarction)  Leukocytosis  Anemia  Hypotension  Acute respiratory failure with hypoxia    Time spent: 40    Calianne Larue, [facility]  [facility] Pager [phone]. If 8PM-8AM, please contact night-coverage at [email or web address], password [facility] 04/19/2012, 8:48 AM  LOS: 5 days

## 2012-04-20 LAB — BASIC METABOLIC PANEL
BUN: 27 mg/dL — ABNORMAL HIGH (ref 6–23)
Chloride: 111 mEq/L (ref 96–112)
Creatinine, Ser: 0.83 mg/dL (ref 0.50–1.10)
GFR calc Af Amer: 70 mL/min — ABNORMAL LOW (ref >90)

## 2012-04-20 LAB — TYPE AND SCREEN
ABO/RH(D): A POS
Antibody Screen: NEGATIVE
Unit division: 0

## 2012-04-20 LAB — CBC
Hemoglobin: 11.4 g/dL — ABNORMAL LOW (ref 12.0–15.0)
MCH: 31.2 pg (ref 26.0–34.0)
MCHC: 33.8 g/dL (ref 30.0–36.0)

## 2012-04-20 NOTE — Progress Notes (Signed)
Subjective: 3 Days Post-Op Procedure(s) (LRB): INTRAMEDULLARY (IM) NAIL FEMORAL (Left) Awake,alert.O x 2.  Patient reports pain as mild.    Objective:   VITALS:  Temp:  [97.3 F (36.3 C)-98.4 F (36.9 C)] 97.4 F (36.3 C) (12/06 0553) Pulse Rate:  [57-108] 93  (12/06 0553) Resp:  [19-24] 22  (12/06 0553) BP: (90-125)/(55-91) 125/73 mmHg (12/06 0900) SpO2:  [95 %-100 %] 98 % (12/06 0930) FiO2 (%):  [40 %] 40 % (12/06 0200) Weight:  [77.2 kg (170 lb 3.1 oz)] 77.2 kg (170 lb 3.1 oz) (12/06 0000)  Neurologically intact   LABS  Basename 04/20/12 0444 04/19/12 1902 04/19/12 0337 04/18/12 1457 04/18/12 0340  HGB 11.4* 12.4 6.8* 8.8* 9.5*  WBC 16.5* 18.2* -- -- --  PLT 170 216 -- -- --    Basename 04/20/12 0444 04/19/12 1902  NA 139 140  K 4.1 3.7  CL 111 108  CO2 20 19  BUN 27* 28*  CREATININE 0.83 0.85  GLUCOSE 170* 143*    Basename 04/17/12 1622  LABPT --  INR 1.29     Assessment/Plan: 3 Days Post-Op Procedure(s) (LRB): INTRAMEDULLARY (IM) NAIL FEMORAL (Left)  Advance diet Up with therapy Discharge to SNF Intense precautions to prevent decubiti. Anasha Perfecto E 04/20/2012, 11:04 AM

## 2012-04-20 NOTE — Progress Notes (Signed)
CSW continues to follow for discharge planning to SNF when medically stable. Note patients first preference is [facility] in [place], CSW spoke with Elease Hashimoto @ [facility] (ph#: [phone]) who states they have no availability today but to check back Monday. CSW will follow-up Monday.    Unice Bailey, LCSW [facility] Clinical Social Worker cell #: [phone]

## 2012-04-20 NOTE — Progress Notes (Signed)
Physical Therapy Treatment Patient Details Name: Amber Love MRN: [id number] DOB: [birth year] Today's Date: 04/20/2012 Time: 1308-6578 PT Time Calculation (min): 30 min  PT Assessment / Plan / Recommendation Comments on Treatment Session  Pt requiring +2 assist but able to participate today.  Pt sat EOB and then performed a couple sit to stands with increased assist.  Pt felt unable to transfer today so assist back to supine.    Follow Up Recommendations  SNF;Supervision/Assistance - 24 hour     Does the patient have the potential to tolerate intense rehabilitation     Barriers to Discharge        Equipment Recommendations  None recommended by PT;None recommended by OT    Recommendations for Other Services    Frequency     Plan Discharge plan remains appropriate;Frequency remains appropriate    Precautions / Restrictions Precautions Precautions: Fall Restrictions Weight Bearing Restrictions: Yes Other Position/Activity Restrictions: WBAT for transfers and 50% PWB if ambulates   Pertinent Vitals/Pain Pt grimaces and moans holding L hip with mobility but appeared peaceful at rest.    Mobility  Bed Mobility Bed Mobility: Rolling Right;Supine to Sit;Sit to Supine Rolling Right: 1: +2 Total assist Rolling Right: Patient Percentage: 10% Supine to Sit: 1: +2 Total assist;HOB elevated Supine to Sit: Patient Percentage: 0% Sitting - Scoot to Edge of Bed: Patient Percentage: 10% Sit to Supine: HOB elevated;1: +2 Total assist Details for Bed Mobility Assistance: verbal cues for technique, pt lethargic with verbal cues to open eyes, increased assist for upper and lower body, pt more awake upon sitting Transfers Transfers: Sit to Stand;Stand to Sit Sit to Stand: 1: +2 Total assist;From bed;From elevated surface Sit to Stand: Patient Percentage: 30% Stand to Sit: 1: +2 Total assist;To bed;To elevated surface Stand to Sit: Patient Percentage: 30% Details for Transfer Assistance:  verbal cues to hold RW and push down (pt wanted to lift RW), increased assist to rise and descend, performed x3 with rest breaks inbetween, pt unable to hold standing (<10 sec)    Exercises     PT Diagnosis:    PT Problem List:   PT Treatment Interventions:     PT Goals Acute Rehab PT Goals PT Goal: Supine/Side to Sit - Progress: Progressing toward goal PT Goal: Sit to Supine/Side - Progress: Progressing toward goal PT Goal: Sit to Stand - Progress: Progressing toward goal PT Goal: Stand to Sit - Progress: Progressing toward goal  Visit Information  Last PT Received On: 04/20/12 Assistance Needed: +2    Subjective Data  Subjective: Can I have some coca cola?  (family member went to retrieve)   Cognition  Overall Cognitive Status: History of cognitive impairments - at baseline Arousal/Alertness: Lethargic    Balance     End of Session PT - End of Session Equipment Utilized During Treatment: Gait belt;Oxygen Activity Tolerance: Patient limited by fatigue;Patient limited by pain Patient left: in bed;with call bell/phone within reach;with bed alarm set;with family/visitor present   GP     Amber Love,Amber Love 04/20/2012, 3:37 PM Pager: [phone]

## 2012-04-20 NOTE — Progress Notes (Signed)
[facility] PROGRESS NOTE  Amber Love LOV:[id number] DOB: March 15, 1921 DOA: 04/14/2012 PCP: Josue Hector, MD  Assessment/Plan:  Hypotension:  LIkely combination of hypovolemia, anemia, and metabolic derangement.  Also consider sepsis.  Blood pressures normal since early this morning.  Afebrile.   -  D/C IVF -  BCx NG x 2 days -  Urine culture negative and CXR negative  -  D/C antibiotics -  Continue to hold BP medications  Acute respiratory failure with hypoxia and tachypnea:  LIkely due to pulmonary edema.   -  D/C IVF -  Will not give lasix due to low-normal blood pressures  Leukocytosis, stable and patient afebrile. Tachypneic and evidence of UTI on previous UA.  WBC trending down.  Likely reactive from hypotension and recent surgery.   -  BCx NG x 2 days -  Urine culture negative and CXR negative  -  D/C antibiotics  Acute anemia:  May be from post-surgical blood loss versus dilutional from IVF.  Resolved with blood transfusion Thrombocytopenia:  Likely due to recent surgery, IVF, acute illness. Resolved.  Displaced intertrochanteric left femur fx POD 3 s/p intramedullar nail -  Appreciate orthopedic assistance -  Partial weight bearing -  Continue xarelto  Metabolic acidosis, nongap:  Resolving.  May be due to ATN and RTA Acute kidney injury:  Increased uop yesterday, creatinine stable today near baseline Transaminitis from 12/4, likely shock liver:  Repeat LFTs in AM. Hypokalemia and hypocalcemia:  Likely due to IVF without calcium and potassium.  Resolved with repletion  NSTEMI:  The highest troponin of 0.88 but has trended down to 0.3 yesterday. 2D echo was done 04/15/2012 and showed ejection fraction 40-45%. -  Continue aspirin, lisinopril and metoprolol when blood pressure tolerates  PAROXYSMAL ATRIAL FIBRILLATION with persistent tachycardia.  Not a candidate for chronic anticoagulation due to frequent falls.   -  Hold metoprolol for hypotension.   Xarelto is for DVT prophylaxis post operatively.    Acute on Chronic systolic heart failure  Based on 2-D echo done 04/15/2012 with ejection fraction 40-45%  Continue management per cardiology with metoprolol and lisinopril when bp tolerates  HYPOTHYROIDISM, stable. Continue levothyroxine 100 mg daily  DIET: Healthy haert  ACCESS: PIV  IVF:  OFF PROPH: Xarelto, SCDs   Code Status: DNR  Family Communication: Patient alone, family not at bedside  Disposition Plan: likely SNF placement pending stable blood pressure and improvement in urine output   Consultants:  Cardiology  Orthopedics  Other consultants:  SLP evaluation  Physical therapy  Social work for skilled nursing facility placement Procedures:  Intramedullary nailing of left femur 12/3  Antibiotics:  Cefazolin 12/3 Vanc & zosyn 12/4 >>12/6  HPI/Subjective:  Has pain in the left leg.  Denies shortness of breath, but is hypoxic and on nasal canula   Objective: Filed Vitals:   04/20/12 0553 04/20/12 0900 04/20/12 0930 04/20/12 1100  BP: 123/86 125/73    Pulse: 93 103 103 100  Temp: 97.4 F (36.3 C)     TempSrc: Oral     Resp: 22     Height:      Weight:      SpO2: 100% 100% 98% 96%    Intake/Output Summary (Last 24 hours) at 04/20/12 1628 Last data filed at 04/20/12 1624  Gross per 24 hour  Intake 1677.5 ml  Output    360 ml  Net 1317.5 ml   Filed Weights   04/17/12 0400 04/18/12 0400 04/20/12 0000  Weight: 67.7 kg (  149 lb 4 oz) 68.8 kg (151 lb 10.8 oz) 77.2 kg (170 lb 3.1 oz)    Exam:  General: Caucasian female, mild respiratory distress with SCM retractions Cardiovascular: Irregularly irregular, tachycardic, 2/6 murmur at apex, no rubs or gallops  Respiratory: Rales to mid back bilaterally Abdomen: Soft, diffusely tender without rebound or guarding, stable from yesterday. No organomegaly  Extremities: cool, pulses DP and PT palpable bilaterally.  Arms with trace edema.  Trace LEE.  Swelling of the  left lateral leg with TTP.  Neuro: Grossly nonfocal  Psych: Alert and oriented to person only.   Data Reviewed: Basic Metabolic Panel:  Lab 04/20/12 1308 04/19/12 1902 04/19/12 0337 04/18/12 1457 04/18/12 0340 04/17/12 0345  NA 139 140 145 143 139 --  K 4.1 3.7 2.7* 3.8 3.5 --  CL 111 108 120* 108 102 --  CO2 20 19 14* 22 24 --  GLUCOSE 170* 143* 95 157* 198* --  BUN 27* 28* 24* 32* 26* --  CREATININE 0.83 0.85 0.71 1.18* 0.98 --  CALCIUM 8.3* 8.6 5.3* 7.7* 8.4 --  MG -- -- -- 2.0 -- 2.1  PHOS -- -- 1.9* 2.9 -- --   Liver Function Tests:  Lab 04/18/12 1457 04/15/12 0343 04/14/12 1945  AST 301* 20 18  ALT 134* 11 11  ALKPHOS 61 83 85  BILITOT 0.7 0.4 0.5  PROT 5.3* 6.6 6.7  ALBUMIN 2.0* 3.0* 3.1*   No results found for this basename: LIPASE:5,AMYLASE:5 in the last 168 hours No results found for this basename: AMMONIA:5 in the last 168 hours CBC:  Lab 04/20/12 0444 04/19/12 1902 04/19/12 0337 04/18/12 1457 04/18/12 0340 04/14/12 1718  WBC 16.5* 18.2* 11.3* 11.9* 18.0* --  NEUTROABS -- -- -- 9.4* -- 7.8*  HGB 11.4* 12.4 6.8* 8.8* 9.5* --  HCT 33.7* 36.3 20.8* 26.9* 30.1* --  MCV 92.3 91.0 95.4 94.7 96.2 --  PLT 170 216 148* 141* 181 --   Cardiac Enzymes:  Lab 04/18/12 1457 04/15/12 0904 04/15/12 0343 04/14/12 1955 04/14/12 1945  CKTOTAL -- -- -- 49 --  CKMB -- -- -- 3.0 --  CKMBINDEX -- -- -- -- --  TROPONINI 0.34* 0.60* 0.88* -- 0.46*   BNP (last 3 results) No results found for this basename: PROBNP:3 in the last 8760 hours CBG:  Lab 04/19/12 0732 04/18/12 0742 04/17/12 0747 04/16/12 0842 04/15/12 1646  GLUCAP 125* 193* 128* 130* 135*    Recent Results (from the past 240 hour(s))  MRSA PCR SCREENING     Status: Normal   Collection Time   04/14/12  9:06 PM      Component Value Range Status Comment   MRSA by PCR NEGATIVE  NEGATIVE Final   URINE CULTURE     Status: Normal   Collection Time   04/18/12  8:20 AM      Component Value Range Status Comment    Specimen Description URINE, CLEAN CATCH   Final    Special Requests NONE   Final    Culture  Setup Time 04/18/2012 14:14   Final    Colony Count NO GROWTH   Final    Culture NO GROWTH   Final    Report Status 04/19/2012 FINAL   Final   CULTURE, BLOOD (ROUTINE X 2)     Status: Normal (Preliminary result)   Collection Time   04/18/12  2:55 PM      Component Value Range Status Comment   Specimen Description BLOOD RIGHT ARM  Final    Special Requests BOTTLES DRAWN AEROBIC ONLY [facility]   Final    Culture  Setup Time 04/18/2012 21:59   Final    Culture     Final    Value:        BLOOD CULTURE RECEIVED NO GROWTH TO DATE CULTURE WILL BE HELD FOR 5 DAYS BEFORE ISSUING A FINAL NEGATIVE REPORT   Report Status PENDING   Incomplete   CULTURE, BLOOD (ROUTINE X 2)     Status: Normal (Preliminary result)   Collection Time   04/18/12  3:01 PM      Component Value Range Status Comment   Specimen Description BLOOD RIGHT HAND   Final    Special Requests BOTTLES DRAWN AEROBIC ONLY [facility]   Final    Culture  Setup Time 04/18/2012 21:59   Final    Culture     Final    Value:        BLOOD CULTURE RECEIVED NO GROWTH TO DATE CULTURE WILL BE HELD FOR 5 DAYS BEFORE ISSUING A FINAL NEGATIVE REPORT   Report Status PENDING   Incomplete      Studies: Dg Chest Port 1 View  04/18/2012  *RADIOLOGY REPORT*  Clinical Data: Hypoxia  PORTABLE CHEST - 1 VIEW  Comparison: Earlier today  Findings: Left chest wall pacer device is noted with lead in the right atrial appendage and right ventricle.  There is moderate cardiac enlargement.  Interstitial edema has increased from previous exam.  IMPRESSION:  1.  Progression of interstitial edema.   Original Report Authenticated By: Signa Kell, M.D.     Scheduled Meds:    . antiseptic oral rinse  15 mL Mouth Rinse q12n4p  . aspirin EC  81 mg Oral Daily  . [COMPLETED] calcium gluconate  2 g Intravenous Once  . chlorhexidine  15 mL Mouth Rinse BID  . docusate sodium  100 mg Oral BID   . escitalopram  10 mg Oral QPM  . levothyroxine  100 mcg Oral QAC breakfast  . piperacillin-tazobactam (ZOSYN)  IV  3.375 g Intravenous Q8H  . rivaroxaban  10 mg Oral Daily  . sodium chloride  3 mL Intravenous Q12H  . vancomycin  1,000 mg Intravenous Q24H   Continuous Infusions:    .  sodium bicarbonate infusion 1000 mL 75 mL/hr at 04/19/12 2257  . [DISCONTINUED] lactated ringers Stopped (04/19/12 1000)    Principal Problem:  *Displaced intertrochanteric fracture of left femur Active Problems:  HYPOTHYROIDISM  HYPERTENSION  PAROXYSMAL ATRIAL FIBRILLATION  Chronic diastolic heart failure  Pacemaker  Falls  NSTEMI (non-ST elevated myocardial infarction)  Leukocytosis  Anemia  Hypotension  Acute respiratory failure with hypoxia    Time spent: 30    Latha Staunton, [facility]  [facility] Pager [phone]. If 8PM-8AM, please contact night-coverage at [email or web address], password [facility] 04/20/2012, 4:28 PM  LOS: 6 days

## 2012-04-21 ENCOUNTER — Inpatient Hospital Stay (HOSPITAL_COMMUNITY): Payer: Medicare Other

## 2012-04-21 LAB — CBC
Hemoglobin: 11.1 g/dL — ABNORMAL LOW (ref 12.0–15.0)
MCH: 30.7 pg (ref 26.0–34.0)
MCV: 93.1 fL (ref 78.0–100.0)
RBC: 3.61 MIL/uL — ABNORMAL LOW (ref 3.87–5.11)

## 2012-04-21 LAB — COMPREHENSIVE METABOLIC PANEL
AST: 77 U/L — ABNORMAL HIGH (ref 0–37)
Albumin: 2 g/dL — ABNORMAL LOW (ref 3.5–5.2)
Calcium: 8.2 mg/dL — ABNORMAL LOW (ref 8.4–10.5)
Creatinine, Ser: 0.76 mg/dL (ref 0.50–1.10)
Total Protein: 5.4 g/dL — ABNORMAL LOW (ref 6.0–8.3)

## 2012-04-21 LAB — GLUCOSE, CAPILLARY

## 2012-04-21 NOTE — Progress Notes (Addendum)
[facility] PROGRESS NOTE  AMYE GREGO ZOX:[id number] DOB: 10-22-1920 DOA: 04/14/2012 PCP: Josue Hector, MD  Assessment/Plan:  Hypotension:  Resolved.  Blood pressure stable -  BCx NG x 3 days -  Continue to hold BP medications  Acute respiratory failure with hypoxia and tachypnea:  Likely due to pulmonary edema and somewhat improved today -  Will not give lasix due to low-normal blood pressures and recent AKI  Leukocytosis, stable and patient afebrile.  UA and CXR neg.  Likely reactive from hypotension and recent surgery.  WBC trending down.   -  BCx NG x 3 days Normocytic anemia:  May be from post-surgical blood loss versus dilutional from IVF.  Displaced intertrochanteric left femur fx POD 3 s/p intramedullar nail -  OOB, PT/OT -  Appreciate orthopedic assistance -  Partial weight bearing -  Continue xarelto  Metabolic acidosis, nongap:  Resolved.  Acute kidney injury:  Slowly increasing uop, creatinine stable today near baseline -  D/c foley catheter Hypokalemia and hypocalcemia:  Resolved with repletion    Transaminitis from 12/4, likely shock liver:  Trending down.  RUQ [place]  NSTEMI:  The highest troponin of 0.88 but has trended down to 0.3.  -  Continue aspirin -  Continue lisinopril and metoprolol when blood pressure and kidney function tolerates -  No statin at this time secondary to recent transaminitis  PAROXYSMAL ATRIAL FIBRILLATION with persistent tachycardia, trending down.  Not a candidate for chronic anticoagulation due to frequent falls.   -  Hold metoprolol today.  If BP stable overnight, will restart in AM -  Xarelto is for DVT prophylaxis post operatively x 4 weeks, then stop  Acute on Chronic systolic heart failure  Based on 2-D echo done 04/15/2012 with ejection fraction 40-45%  Metoprolol and lisinopril when bp tolerates  HYPOTHYROIDISM, stable. Continue levothyroxine 100 mg daily  DIET: Healthy haert  ACCESS: PIV  IVF:   OFF PROPH: Xarelto, SCDs   Code Status: DNR  Family Communication: Patient alone, family not at bedside  Disposition Plan:  SNF placement next week  Consultants:  Cardiology  Orthopedics  Other consultants:  SLP evaluation  Physical therapy  Social work for skilled nursing facility placement Procedures:  Intramedullary nailing of left femur 12/3  Antibiotics:  Cefazolin 12/3 Vanc & zosyn 12/4 >>12/6  HPI/Subjective:  Has pain in the left leg.  Denies shortness of breath, chest pain, nausea.  States she is having BM.    Objective: Filed Vitals:   04/20/12 1100 04/20/12 2102 04/21/12 0542 04/21/12 1310  BP:  110/63 129/63 121/78  Pulse: 100 80 77 96  Temp:  97.9 F (36.6 C) 97.9 F (36.6 C) 97.9 F (36.6 C)  TempSrc:  Oral Oral Oral  Resp:  20 22 20   Height:      Weight:      SpO2: 96% 98% 99% 100%    Intake/Output Summary (Last 24 hours) at 04/21/12 1400 Last data filed at 04/21/12 1322  Gross per 24 hour  Intake    940 ml  Output    751 ml  Net    189 ml   Filed Weights   04/17/12 0400 04/18/12 0400 04/20/12 0000  Weight: 67.7 kg (149 lb 4 oz) 68.8 kg (151 lb 10.8 oz) 77.2 kg (170 lb 3.1 oz)    Exam:  General: Caucasian female, no respiratory distress, nasal canula in place Cardiovascular: Irregularly irregular, tachycardic, 2/6 murmur at apex, no rubs or gallops  Respiratory: Rales to mid  back bilaterally Abdomen: Soft, diffusely tender without rebound or guarding, stable from yesterday.  Extremities: Trace LEE.  Stable swelling of the left lateral leg with TTP.  Psych: Alert but not oriented.  Cannot remember her name today.   Data Reviewed: Basic Metabolic Panel:  Lab 04/21/12 1610 04/20/12 0444 04/19/12 1902 04/19/12 0337 04/18/12 1457 04/17/12 0345  NA 137 139 140 145 143 --  K 3.7 4.1 3.7 2.7* 3.8 --  CL 105 111 108 120* 108 --  CO2 23 20 19  14* 22 --  GLUCOSE 120* 170* 143* 95 157* --  BUN 18 27* 28* 24* 32* --  CREATININE 0.76 0.83 0.85  0.71 1.18* --  CALCIUM 8.2* 8.3* 8.6 5.3* 7.7* --  MG -- -- -- -- 2.0 2.1  PHOS -- -- -- 1.9* 2.9 --   Liver Function Tests:  Lab 04/21/12 0645 04/18/12 1457 04/15/12 0343 04/14/12 1945  AST 77* 301* 20 18  ALT 64* 134* 11 11  ALKPHOS 75 61 83 85  BILITOT 1.4* 0.7 0.4 0.5  PROT 5.4* 5.3* 6.6 6.7  ALBUMIN 2.0* 2.0* 3.0* 3.1*   No results found for this basename: LIPASE:5,AMYLASE:5 in the last 168 hours No results found for this basename: AMMONIA:5 in the last 168 hours CBC:  Lab 04/21/12 0645 04/20/12 0444 04/19/12 1902 04/19/12 0337 04/18/12 1457 04/14/12 1718  WBC 15.8* 16.5* 18.2* 11.3* 11.9* --  NEUTROABS -- -- -- -- 9.4* 7.8*  HGB 11.1* 11.4* 12.4 6.8* 8.8* --  HCT 33.6* 33.7* 36.3 20.8* 26.9* --  MCV 93.1 92.3 91.0 95.4 94.7 --  PLT 204 170 216 148* 141* --   Cardiac Enzymes:  Lab 04/18/12 1457 04/15/12 0904 04/15/12 0343 04/14/12 1955 04/14/12 1945  CKTOTAL -- -- -- 49 --  CKMB -- -- -- 3.0 --  CKMBINDEX -- -- -- -- --  TROPONINI 0.34* 0.60* 0.88* -- 0.46*   BNP (last 3 results) No results found for this basename: PROBNP:3 in the last 8760 hours CBG:  Lab 04/21/12 0808 04/19/12 0732 04/18/12 0742 04/17/12 0747 04/16/12 0842  GLUCAP 117* 125* 193* 128* 130*    Recent Results (from the past 240 hour(s))  MRSA PCR SCREENING     Status: Normal   Collection Time   04/14/12  9:06 PM      Component Value Range Status Comment   MRSA by PCR NEGATIVE  NEGATIVE Final   URINE CULTURE     Status: Normal   Collection Time   04/18/12  8:20 AM      Component Value Range Status Comment   Specimen Description URINE, CLEAN CATCH   Final    Special Requests NONE   Final    Culture  Setup Time 04/18/2012 14:14   Final    Colony Count NO GROWTH   Final    Culture NO GROWTH   Final    Report Status 04/19/2012 FINAL   Final   CULTURE, BLOOD (ROUTINE X 2)     Status: Normal (Preliminary result)   Collection Time   04/18/12  2:55 PM      Component Value Range Status Comment    Specimen Description BLOOD RIGHT ARM   Final    Special Requests BOTTLES DRAWN AEROBIC ONLY [facility]   Final    Culture  Setup Time 04/18/2012 21:59   Final    Culture     Final    Value:        BLOOD CULTURE RECEIVED NO GROWTH TO DATE  CULTURE WILL BE HELD FOR 5 DAYS BEFORE ISSUING A FINAL NEGATIVE REPORT   Report Status PENDING   Incomplete   CULTURE, BLOOD (ROUTINE X 2)     Status: Normal (Preliminary result)   Collection Time   04/18/12  3:01 PM      Component Value Range Status Comment   Specimen Description BLOOD RIGHT HAND   Final    Special Requests BOTTLES DRAWN AEROBIC ONLY [facility]   Final    Culture  Setup Time 04/18/2012 21:59   Final    Culture     Final    Value:        BLOOD CULTURE RECEIVED NO GROWTH TO DATE CULTURE WILL BE HELD FOR 5 DAYS BEFORE ISSUING A FINAL NEGATIVE REPORT   Report Status PENDING   Incomplete      Studies: No results found.  Scheduled Meds:    . antiseptic oral rinse  15 mL Mouth Rinse q12n4p  . aspirin EC  81 mg Oral Daily  . chlorhexidine  15 mL Mouth Rinse BID  . docusate sodium  100 mg Oral BID  . escitalopram  10 mg Oral QPM  . levothyroxine  100 mcg Oral QAC breakfast  . rivaroxaban  10 mg Oral Daily  . sodium chloride  3 mL Intravenous Q12H  . [DISCONTINUED] piperacillin-tazobactam (ZOSYN)  IV  3.375 g Intravenous Q8H  . [DISCONTINUED] vancomycin  1,000 mg Intravenous Q24H   Continuous Infusions:    . [DISCONTINUED]  sodium bicarbonate infusion 1000 mL 75 mL/hr at 04/19/12 2257    Principal Problem:  *Displaced intertrochanteric fracture of left femur Active Problems:  HYPOTHYROIDISM  HYPERTENSION  PAROXYSMAL ATRIAL FIBRILLATION  Chronic diastolic heart failure  Pacemaker  Falls  NSTEMI (non-ST elevated myocardial infarction)  Leukocytosis  Anemia  Hypotension  Acute respiratory failure with hypoxia    Time spent: 30    Andretta Ergle, [facility]  [facility] Pager [phone]. If 8PM-8AM, please contact night-coverage at  [email or web address], password [facility] 04/21/2012, 2:00 PM  LOS: 7 days

## 2012-04-21 NOTE — Progress Notes (Signed)
Pt stable Mild pain with left leg rom snf next week

## 2012-04-22 ENCOUNTER — Inpatient Hospital Stay (HOSPITAL_COMMUNITY): Payer: Medicare Other

## 2012-04-22 DIAGNOSIS — R601 Generalized edema: Secondary | ICD-10-CM | POA: Insufficient documentation

## 2012-04-22 LAB — CBC
HCT: 33.6 % — ABNORMAL LOW (ref 36.0–46.0)
Hemoglobin: 11 g/dL — ABNORMAL LOW (ref 12.0–15.0)
MCV: 94.6 fL (ref 78.0–100.0)
RBC: 3.55 MIL/uL — ABNORMAL LOW (ref 3.87–5.11)
WBC: 14.4 10*3/uL — ABNORMAL HIGH (ref 4.0–10.5)

## 2012-04-22 LAB — COMPREHENSIVE METABOLIC PANEL
ALT: 60 U/L — ABNORMAL HIGH (ref 0–35)
AST: 72 U/L — ABNORMAL HIGH (ref 0–37)
Albumin: 2.2 g/dL — ABNORMAL LOW (ref 3.5–5.2)
CO2: 26 mEq/L (ref 19–32)
Calcium: 8.2 mg/dL — ABNORMAL LOW (ref 8.4–10.5)
Chloride: 104 mEq/L (ref 96–112)
GFR calc non Af Amer: 76 mL/min — ABNORMAL LOW (ref >90)
Sodium: 138 mEq/L (ref 135–145)
Total Bilirubin: 1.5 mg/dL — ABNORMAL HIGH (ref 0.3–1.2)

## 2012-04-22 LAB — GLUCOSE, CAPILLARY

## 2012-04-22 MED ORDER — SODIUM CHLORIDE 0.9 % IV BOLUS (SEPSIS)
250.0000 mL | Freq: Once | INTRAVENOUS | Status: AC
  Administered 2012-04-22: 250 mL via INTRAVENOUS

## 2012-04-22 MED ORDER — METOPROLOL TARTRATE 12.5 MG HALF TABLET
12.5000 mg | ORAL_TABLET | Freq: Two times a day (BID) | ORAL | Status: DC
  Filled 2012-04-22 (×2): qty 1

## 2012-04-22 MED ORDER — FUROSEMIDE 20 MG PO TABS
20.0000 mg | ORAL_TABLET | Freq: Every day | ORAL | Status: DC
  Administered 2012-04-22 – 2012-04-24 (×3): 20 mg via ORAL
  Filled 2012-04-22 (×4): qty 1

## 2012-04-22 NOTE — Progress Notes (Signed)
Pt with foley and lasix pending Incisions ok rom hip left mildly painful

## 2012-04-22 NOTE — Progress Notes (Signed)
04-22-12  Attempts to wean pt to RA while asleep fail b/c pt desats to 85 -88%.  Was able to tolerate 1l/m [place] while asleep and kept sats in  Mid 90's

## 2012-04-22 NOTE — Progress Notes (Addendum)
[facility] PROGRESS NOTE  Amber Love WUJ:[id number] DOB: August 04, 1920 DOA: 04/14/2012 PCP: Josue Hector, MD  Assessment/Plan:  Hypotension:  Resolved.  Blood pressure stable.   -  BCx NGTD -  Hold ACEI and BB -  Starting some low-dose lasix today   Acute respiratory failure with hypoxia and tachypnea:  Improving.  Down to 1L [place] while asleep now -  Spoke with Dr. Arlean Hopping who was okay with starting some lasix  Leukocytosis, stable and patient afebrile.  UA and CXR neg.  Likely reactive from hypotension and recent surgery.  WBC trending down.   -  BCx NGTD Normocytic anemia:  May be from post-surgical blood loss versus dilutional from IVF.  Displaced intertrochanteric left femur fx POD 3 s/p intramedullar nail -  OOB, PT/OT -  Appreciate orthopedic assistance -  Partial weight bearing -  Continue xarelto  Acute kidney injury:  UOP decreased yesterday after removing foley.  Was given a bolus overnight to improve uop without effect.  PVR ~152mL -  Spoke with Dr. Arlean Hopping who was okay with starting some lasix  Transaminitis from 12/4, likely shock liver:  Trending down.   -  RUQ [place] pending  NSTEMI:  The highest troponin of 0.88 but has trended down to 0.3.  -  Continue aspirin -  Hold lisinopril due to recent AKI and BB as starting lasix -  No statin at this time secondary to recent transaminitis  PAROXYSMAL ATRIAL FIBRILLATION with persistent tachycardia, trending down.  Not a candidate for chronic anticoagulation due to frequent falls.   -  Restart low dose metoprolol -  Xarelto is for DVT prophylaxis post operatively x 4 weeks, then stop  Acute on Chronic systolic heart failure  Based on 2-D echo done 04/15/2012 with ejection fraction 40-45%  Hold lisinopril and metop  HYPOTHYROIDISM, stable. Continue levothyroxine 100 mg daily  DIET: Healthy haert  ACCESS: PIV  IVF:  OFF PROPH: Xarelto, SCDs   Code Status: DNR  Family Communication: Patient alone,  family not at bedside  Disposition Plan:  SNF placement next week  Consultants:  Cardiology  Orthopedics  Other consultants:  SLP evaluation  Physical therapy  Social work for skilled nursing facility placement Procedures:  Intramedullary nailing of left femur 12/3  Antibiotics:  Cefazolin 12/3 Vanc & zosyn 12/4 >>12/6  HPI/Subjective:  Has pain in the left leg.  Has some mild shortness of breath.  Denies chest pain, nausea, abdominal pain.    Objective: Filed Vitals:   04/21/12 0542 04/21/12 1310 04/21/12 2210 04/22/12 0648  BP: 129/63 121/78 128/72 121/58  Pulse: 77 96 81 104  Temp: 97.9 F (36.6 C) 97.9 F (36.6 C) 97.9 F (36.6 C) 97.4 F (36.3 C)  TempSrc: Oral Oral Oral Oral  Resp: 22 20 20 20   Height:      Weight:      SpO2: 99% 100% 93% 96%    Intake/Output Summary (Last 24 hours) at 04/22/12 0906 Last data filed at 04/22/12 0600  Gross per 24 hour  Intake    475 ml  Output    252 ml  Net    223 ml   Filed Weights   04/17/12 0400 04/18/12 0400 04/20/12 0000  Weight: 67.7 kg (149 lb 4 oz) 68.8 kg (151 lb 10.8 oz) 77.2 kg (170 lb 3.1 oz)    Exam:  General: Caucasian female, no respiratory distress, room air Cardiovascular: Irregularly irregular, tachycardic, 2/6 murmur at apex, no rubs or gallops  Respiratory: Fine  rales in dependent areas Abdomen: NABS, soft, nontender, nondistended Extremities: Anasarca.  Also stable swelling of the left lateral leg but less TTP.  Psych: Alert and oriented to person today.  Better able to answer questions.   Data Reviewed: Basic Metabolic Panel:  Lab 04/22/12 1610 04/21/12 0645 04/20/12 0444 04/19/12 1902 04/19/12 0337 04/18/12 1457 04/17/12 0345  NA 138 137 139 140 145 -- --  K 4.0 3.7 4.1 3.7 2.7* -- --  CL 104 105 111 108 120* -- --  CO2 26 23 20 19  14* -- --  GLUCOSE 105* 120* 170* 143* 95 -- --  BUN 15 18 27* 28* 24* -- --  CREATININE 0.67 0.76 0.83 0.85 0.71 -- --  CALCIUM 8.2* 8.2* 8.3* 8.6 5.3* --  --  MG -- -- -- -- -- 2.0 2.1  PHOS -- -- -- -- 1.9* 2.9 --   Liver Function Tests:  Lab 04/22/12 0529 04/21/12 0645 04/18/12 1457  AST 72* 77* 301*  ALT 60* 64* 134*  ALKPHOS 78 75 61  BILITOT 1.5* 1.4* 0.7  PROT 5.7* 5.4* 5.3*  ALBUMIN 2.2* 2.0* 2.0*   No results found for this basename: LIPASE:5,AMYLASE:5 in the last 168 hours No results found for this basename: AMMONIA:5 in the last 168 hours CBC:  Lab 04/22/12 0529 04/21/12 0645 04/20/12 0444 04/19/12 1902 04/19/12 0337 04/18/12 1457  WBC 14.4* 15.8* 16.5* 18.2* 11.3* --  NEUTROABS -- -- -- -- -- 9.4*  HGB 11.0* 11.1* 11.4* 12.4 6.8* --  HCT 33.6* 33.6* 33.7* 36.3 20.8* --  MCV 94.6 93.1 92.3 91.0 95.4 --  PLT 235 204 170 216 148* --   Cardiac Enzymes:  Lab 04/18/12 1457  CKTOTAL --  CKMB --  CKMBINDEX --  TROPONINI 0.34*   BNP (last 3 results) No results found for this basename: PROBNP:3 in the last 8760 hours CBG:  Lab 04/21/12 0808 04/19/12 0732 04/18/12 0742 04/17/12 0747 04/16/12 0842  GLUCAP 117* 125* 193* 128* 130*    Recent Results (from the past 240 hour(s))  MRSA PCR SCREENING     Status: Normal   Collection Time   04/14/12  9:06 PM      Component Value Range Status Comment   MRSA by PCR NEGATIVE  NEGATIVE Final   URINE CULTURE     Status: Normal   Collection Time   04/18/12  8:20 AM      Component Value Range Status Comment   Specimen Description URINE, CLEAN CATCH   Final    Special Requests NONE   Final    Culture  Setup Time 04/18/2012 14:14   Final    Colony Count NO GROWTH   Final    Culture NO GROWTH   Final    Report Status 04/19/2012 FINAL   Final   CULTURE, BLOOD (ROUTINE X 2)     Status: Normal (Preliminary result)   Collection Time   04/18/12  2:55 PM      Component Value Range Status Comment   Specimen Description BLOOD RIGHT ARM   Final    Special Requests BOTTLES DRAWN AEROBIC ONLY [facility]   Final    Culture  Setup Time 04/18/2012 21:59   Final    Culture     Final    Value:         BLOOD CULTURE RECEIVED NO GROWTH TO DATE CULTURE WILL BE HELD FOR 5 DAYS BEFORE ISSUING A FINAL NEGATIVE REPORT   Report Status PENDING   Incomplete  CULTURE, BLOOD (ROUTINE X 2)     Status: Normal (Preliminary result)   Collection Time   04/18/12  3:01 PM      Component Value Range Status Comment   Specimen Description BLOOD RIGHT HAND   Final    Special Requests BOTTLES DRAWN AEROBIC ONLY 3CC   Final    Culture  Setup Time 04/18/2012 21:59   Final    Culture     Final    Value:        BLOOD CULTURE RECEIVED NO GROWTH TO DATE CULTURE WILL BE HELD FOR 5 DAYS BEFORE ISSUING A FINAL NEGATIVE REPORT   Report Status PENDING   Incomplete      Studies: No results found.  Scheduled Meds:    . antiseptic oral rinse  15 mL Mouth Rinse q12n4p  . aspirin EC  81 mg Oral Daily  . chlorhexidine  15 mL Mouth Rinse BID  . docusate sodium  100 mg Oral BID  . escitalopram  10 mg Oral QPM  . levothyroxine  100 mcg Oral QAC breakfast  . rivaroxaban  10 mg Oral Daily  . [COMPLETED] sodium chloride  250 mL Intravenous Once  . sodium chloride  3 mL Intravenous Q12H   Continuous Infusions:    Principal Problem:  *Displaced intertrochanteric fracture of left femur Active Problems:  HYPOTHYROIDISM  HYPERTENSION  PAROXYSMAL ATRIAL FIBRILLATION  Chronic diastolic heart failure  Pacemaker  Falls  NSTEMI (non-ST elevated myocardial infarction)  Leukocytosis  Anemia  Hypotension  Acute respiratory failure with hypoxia    Time spent: 30    Martiza Speth, [facility]  [facility] Pager [phone]. If 8PM-8AM, please contact night-coverage at [email or web address], password [facility] 04/22/2012, 9:06 AM  LOS: 8 days

## 2012-04-22 NOTE — Progress Notes (Signed)
04-22-12 0300 NSG:  Pt had foley removed at 1900, had only had 250 cc's of urine during 12 hour day shift.  Pt has had 3 losse/soft brown moderate stools in past 8 hours but no urine, pt has drank 240'cc's in past 8 hours.  pt denies urge to void and bladder scan reveals only 85cc's .  Lenny Pastel on call and notified.  Acknowledges info and orders received for a bolus.  Will continue to monitor

## 2012-04-23 ENCOUNTER — Inpatient Hospital Stay (HOSPITAL_COMMUNITY): Payer: Medicare Other

## 2012-04-23 DIAGNOSIS — R609 Edema, unspecified: Secondary | ICD-10-CM

## 2012-04-23 LAB — CBC
HCT: 35 % — ABNORMAL LOW (ref 36.0–46.0)
Hemoglobin: 11.6 g/dL — ABNORMAL LOW (ref 12.0–15.0)
WBC: 15 10*3/uL — ABNORMAL HIGH (ref 4.0–10.5)

## 2012-04-23 LAB — GLUCOSE, CAPILLARY
Glucose-Capillary: 103 mg/dL — ABNORMAL HIGH (ref 70–99)
Glucose-Capillary: 124 mg/dL — ABNORMAL HIGH (ref 70–99)

## 2012-04-23 LAB — URINALYSIS, ROUTINE W REFLEX MICROSCOPIC
Glucose, UA: NEGATIVE mg/dL
Specific Gravity, Urine: 1.01 (ref 1.005–1.030)
Urobilinogen, UA: 2 mg/dL — ABNORMAL HIGH (ref 0.0–1.0)

## 2012-04-23 LAB — BASIC METABOLIC PANEL
BUN: 10 mg/dL (ref 6–23)
Chloride: 102 mEq/L (ref 96–112)
GFR calc Af Amer: >90 mL/min (ref >90)
Glucose, Bld: 113 mg/dL — ABNORMAL HIGH (ref 70–99)
Potassium: 3.5 mEq/L (ref 3.5–5.1)

## 2012-04-23 LAB — URINE MICROSCOPIC-ADD ON

## 2012-04-23 MED ORDER — LISINOPRIL 2.5 MG PO TABS
2.5000 mg | ORAL_TABLET | Freq: Every day | ORAL | Status: DC
  Administered 2012-04-23 – 2012-04-26 (×4): 2.5 mg via ORAL
  Filled 2012-04-23 (×4): qty 1

## 2012-04-23 MED ORDER — LEVOFLOXACIN IN D5W 750 MG/150ML IV SOLN
750.0000 mg | INTRAVENOUS | Status: DC
  Administered 2012-04-23 – 2012-04-24 (×2): 750 mg via INTRAVENOUS
  Filled 2012-04-23 (×2): qty 150

## 2012-04-23 MED ORDER — FUROSEMIDE 10 MG/ML IJ SOLN
40.0000 mg | Freq: Once | INTRAMUSCULAR | Status: AC
  Administered 2012-04-23: 40 mg via INTRAVENOUS
  Filled 2012-04-23: qty 4

## 2012-04-23 NOTE — Progress Notes (Signed)
[facility] PROGRESS NOTE  Amber Love JYN:[id number] DOB: 07-Oct-1920 DOA: 04/14/2012 PCP: Josue Hector, MD  Assessment/Plan:  Acute respiratory failure with hypoxia and tachypnea:   Likely due to volume overload after resuscitation for hypoTN and some ATN, but may have superimposed pneumonia.   -  More aggressive lasix as bp tolerates -  Start levofloxacin for possible pneumonia -  Repeat CXR after more diuresis and if "infiltrate" clears, d/c abx  Leukocytosis, stable and patient afebrile.  UA and CXR neg.  Likely reactive from hypotension and recent surgery.  WBC stable and elevated.     -  BCx NGTD -  Levofloxacin for possible pneumonia -  UA pending  Normocytic anemia:  May be from post-surgical blood loss versus dilutional from IVF.  Displaced intertrochanteric left femur fx POD 3 s/p intramedullar nail -  OOB, PT/OT -  Appreciate orthopedic assistance -  Partial weight bearing -  Continue xarelto  Acute kidney injury resolved but with persistent oliguria.  UOP improved with lasix Transaminitis from 12/4, likely shock liver:  Trending down and RUQ neg.  NSTEMI:  The highest troponin of 0.88 but has trended down to 0.3.  -  Continue aspirin -  Restart lisinopril and BB at low dose -  No statin at this time secondary to recent transaminitis  PAROXYSMAL ATRIAL FIBRILLATION with persistent tachycardia, trending down.  Not a candidate for chronic anticoagulation due to frequent falls.   -  Restart low dose metoprolol and ACEI -  Xarelto is for DVT prophylaxis post operatively x 4 weeks, then stop  Acute on Chronic systolic heart failure  Based on 2-D echo done 04/15/2012 with ejection fraction 40-45%  Restart low dose lisinopril and metop  HYPOTHYROIDISM, stable. Continue levothyroxine 100 mg daily  DIET: Healthy haert  ACCESS: PIV  IVF:  OFF PROPH: Xarelto, SCDs   Code Status: DNR  Family Communication: Patient alone, family not at bedside   Disposition Plan:  SNF placement next week  Consultants:  Cardiology  Orthopedics  Other consultants:  SLP evaluation  Physical therapy  Social work for skilled nursing facility placement Procedures:  Intramedullary nailing of left femur 12/3  Antibiotics:  Cefazolin 12/3 Vanc & zosyn 12/4 >>12/6  HPI/Subjective:  Has pain in the left leg.  Has some mild shortness of breath.  Denies chest pain, nausea, abdominal pain.    Objective: Filed Vitals:   04/22/12 1451 04/22/12 2237 04/23/12 0500 04/23/12 0612  BP:  142/78  154/79  Pulse: 82 101  88  Temp:  98.3 F (36.8 C)  98.5 F (36.9 C)  TempSrc:  Oral  Oral  Resp: 20 20  18   Height:      Weight:   82.1 kg (181 lb)   SpO2: 98% 98%  96%    Intake/Output Summary (Last 24 hours) at 04/23/12 1342 Last data filed at 04/23/12 8657  Gross per 24 hour  Intake    140 ml  Output   1050 ml  Net   -910 ml   Filed Weights   04/18/12 0400 04/20/12 0000 04/23/12 0500  Weight: 68.8 kg (151 lb 10.8 oz) 77.2 kg (170 lb 3.1 oz) 82.1 kg (181 lb)    Exam:  General: Caucasian female, no respiratory distress, room air Cardiovascular: Irregularly irregular, tachycardic, 2/6 murmur at apex, no rubs or gallops  Respiratory: Fine rales in dependent areas Abdomen: NABS, soft, nontender, nondistended Extremities: Anasarca.  Also stable swelling of the left lateral leg but less TTP.  Psych: Alert and oriented to person today.  Better able to answer questions.   Data Reviewed: Basic Metabolic Panel:  Lab 04/23/12 1610 04/22/12 0529 04/21/12 0645 04/20/12 0444 04/19/12 1902 04/19/12 0337 04/18/12 1457 04/17/12 0345  NA 137 138 137 139 140 -- -- --  K 3.5 4.0 3.7 4.1 3.7 -- -- --  CL 102 104 105 111 108 -- -- --  CO2 25 26 23 20 19  -- -- --  GLUCOSE 113* 105* 120* 170* 143* -- -- --  BUN 10 15 18  27* 28* -- -- --  CREATININE 0.61 0.67 0.76 0.83 0.85 -- -- --  CALCIUM 8.2* 8.2* 8.2* 8.3* 8.6 -- -- --  MG -- -- -- -- -- -- 2.0 2.1   PHOS -- -- -- -- -- 1.9* 2.9 --   Liver Function Tests:  Lab 04/22/12 0529 04/21/12 0645 04/18/12 1457  AST 72* 77* 301*  ALT 60* 64* 134*  ALKPHOS 78 75 61  BILITOT 1.5* 1.4* 0.7  PROT 5.7* 5.4* 5.3*  ALBUMIN 2.2* 2.0* 2.0*   No results found for this basename: LIPASE:5,AMYLASE:5 in the last 168 hours No results found for this basename: AMMONIA:5 in the last 168 hours CBC:  Lab 04/23/12 0435 04/22/12 0529 04/21/12 0645 04/20/12 0444 04/19/12 1902 04/18/12 1457  WBC 15.0* 14.4* 15.8* 16.5* 18.2* --  NEUTROABS -- -- -- -- -- 9.4*  HGB 11.6* 11.0* 11.1* 11.4* 12.4 --  HCT 35.0* 33.6* 33.6* 33.7* 36.3 --  MCV 93.8 94.6 93.1 92.3 91.0 --  PLT 281 235 204 170 216 --   Cardiac Enzymes:  Lab 04/18/12 1457  CKTOTAL --  CKMB --  CKMBINDEX --  TROPONINI 0.34*   BNP (last 3 results) No results found for this basename: PROBNP:3 in the last 8760 hours CBG:  Lab 04/23/12 0709 04/22/12 0925 04/21/12 0808 04/19/12 0732 04/18/12 0742  GLUCAP 102* 78 117* 125* 193*    Recent Results (from the past 240 hour(s))  MRSA PCR SCREENING     Status: Normal   Collection Time   04/14/12  9:06 PM      Component Value Range Status Comment   MRSA by PCR NEGATIVE  NEGATIVE Final   URINE CULTURE     Status: Normal   Collection Time   04/18/12  8:20 AM      Component Value Range Status Comment   Specimen Description URINE, CLEAN CATCH   Final    Special Requests NONE   Final    Culture  Setup Time 04/18/2012 14:14   Final    Colony Count NO GROWTH   Final    Culture NO GROWTH   Final    Report Status 04/19/2012 FINAL   Final   CULTURE, BLOOD (ROUTINE X 2)     Status: Normal (Preliminary result)   Collection Time   04/18/12  2:55 PM      Component Value Range Status Comment   Specimen Description BLOOD RIGHT ARM   Final    Special Requests BOTTLES DRAWN AEROBIC ONLY 7[facility]   Final    Culture  Setup Time 04/18/2012 21:59   Final    Culture     Final    Value:        BLOOD CULTURE RECEIVED NO  GROWTH TO DATE CULTURE WILL BE HELD FOR 5 DAYS BEFORE ISSUING A FINAL NEGATIVE REPORT   Report Status PENDING   Incomplete   CULTURE, BLOOD (ROUTINE X 2)     Status: Normal (  Preliminary result)   Collection Time   04/18/12  3:01 PM      Component Value Range Status Comment   Specimen Description BLOOD RIGHT HAND   Final    Special Requests BOTTLES DRAWN AEROBIC ONLY 3CC   Final    Culture  Setup Time 04/18/2012 21:59   Final    Culture     Final    Value:        BLOOD CULTURE RECEIVED NO GROWTH TO DATE CULTURE WILL BE HELD FOR 5 DAYS BEFORE ISSUING A FINAL NEGATIVE REPORT   Report Status PENDING   Incomplete      Studies: US Abdomen Complete  04/22/2012  *RADIOLOGY REPORT*  Clinical Data:  78 year old female with elevated LFTs and bilirubin.  History of bladder cancer.  ABDOMINAL ULTRASOUND COMPLETE  Comparison:  None.  Findings:  Gallbladder:  The gallbladder is unremarkable. There is no evidence of gallstones, gallbladder wall thickening, or pericholecystic fluid.  Common Bile Duct:  There is no evidence of intrahepatic or extrahepatic biliary dilation. The CBD measures 3.9 mm in greatest diameter.  Liver:  The liver is within normal limits in parenchymal echogenicity. No focal abnormalities are identified.  IVC:  Appears normal.  Pancreas:  Although the pancreas is difficult to visualize in its entirety, no focal pancreatic abnormality is identified.  Spleen:  Within normal limits in size and echotexture.  Right kidney:  The right kidney is normal in size and parenchymal echogenicity. Cortical thinning is noted.  There is no evidence of solid mass, hydronephrosis or definite renal calculi.  The right kidney measures 10.7 cm.  Left kidney:  The left kidney is normal in size and parenchymal echogenicity. Cortical thinning is noted.  There is no evidence of solid mass, hydronephrosis or definite renal calculi.   The left kidney measures 10.2 cm.  Abdominal Aorta:  No abdominal aortic aneurysm  identified. Atherosclerotic plaque is present.  There is no evidence of ascites.  IMPRESSION:  No evidence of acute abnormality.  Unremarkable liver sonographically.  No evidence of biliary dilatation.  Bilateral renal cortical thinning / atrophy.   Original Report Authenticated By: Harmon Pier, M.D.    Dg Chest Port 1 View  04/23/2012  *RADIOLOGY REPORT*  Clinical Data: Elevated white blood cell, shortness of breath  CHEST - 1 VIEW  Comparison: Portable chest x-ray of 04/18/2012  Findings: There is more patchy opacity in the right upper lung field which may represent pneumonia superimposed on mild congestive heart failure.  Moderate cardiomegaly remains and a permanent pacemaker is unchanged.  There do appear to be small effusions present.  IMPRESSION: Changes of CHF with possible pneumonia superimposed in the right upper lobe.  Recommend follow-up.   Original Report Authenticated By: Dwyane Dee, M.D.     Scheduled Meds:    . antiseptic oral rinse  15 mL Mouth Rinse q12n4p  . aspirin EC  81 mg Oral Daily  . chlorhexidine  15 mL Mouth Rinse BID  . docusate sodium  100 mg Oral BID  . escitalopram  10 mg Oral QPM  . furosemide  40 mg Intravenous Once  . furosemide  20 mg Oral Daily  . levofloxacin  750 mg Intravenous Q24H  . levothyroxine  100 mcg Oral QAC breakfast  . rivaroxaban  10 mg Oral Daily  . sodium chloride  3 mL Intravenous Q12H   Continuous Infusions:    Principal Problem:  *Displaced intertrochanteric fracture of left femur Active Problems:  HYPOTHYROIDISM  HYPERTENSION  PAROXYSMAL ATRIAL FIBRILLATION  Chronic diastolic heart failure  Pacemaker  Falls  NSTEMI (non-ST elevated myocardial infarction)  Leukocytosis  Anemia  Hypotension  Acute respiratory failure with hypoxia  Anasarca    Time spent: 30    Jermaine Neuharth, [facility]  [facility] Pager [phone]. If 8PM-8AM, please contact night-coverage at [email or web address], password [facility] 04/23/2012, 1:42 PM  LOS: 9 days

## 2012-04-23 NOTE — Progress Notes (Signed)
Per nursing Ms. Frankowski was not ready from a medical standpoint. I was able to ascertain a skilled facility bed offer from [facility] which the family has accepted. Our csw team will follow up tomorrow.  Delphia Grates, Marine scientist Social Work [employer]

## 2012-04-23 NOTE — Progress Notes (Signed)
Subjective: 6 Days Post-Op Procedure(s) (LRB): INTRAMEDULLARY (IM) NAIL FEMORAL (Left) Awake, alert O x 2. Baseline dementia.tolerating pos Patient reports pain as mild.    Objective:   VITALS:  Temp:  [98.3 F (36.8 C)-98.5 F (36.9 C)] 98.5 F (36.9 C) (12/09 0612) Pulse Rate:  [82-101] 88  (12/09 0612) Resp:  [18-20] 18  (12/09 0612) BP: (142-154)/(78-79) 154/79 mmHg (12/09 0612) SpO2:  [96 %-98 %] 96 % (12/09 0612) Weight:  [82.1 kg (181 lb)] 82.1 kg (181 lb) (12/09 0500)  Neurologically intact ABD soft Neurovascular intact Sensation intact distally Intact pulses distally Dorsiflexion/Plantar flexion intact Incision: moderate drainage No cellulitis present   LABS  Basename 04/23/12 0435 04/22/12 0529 04/21/12 0645  HGB 11.6* 11.0* 11.1*  WBC 15.0* 14.4* --  PLT 281 235 --    Basename 04/23/12 0435 04/22/12 0529  NA 137 138  K 3.5 4.0  CL 102 104  CO2 25 26  BUN 10 15  CREATININE 0.61 0.67  GLUCOSE 113* 105*   No results found for this basename: LABPT:2,INR:2 in the last 72 hours   Assessment/Plan: 6 Days Post-Op Procedure(s) (LRB): INTRAMEDULLARY (IM) NAIL FEMORAL (Left)  Advance diet Up with therapy Continue foley due to diuresing patient and strict I&O Discharge to SNF Orthopaedically stable. When medically indicated SNF.  NITKA,JAMES E 04/23/2012, 9:26 AM

## 2012-04-23 NOTE — Progress Notes (Signed)
Physical Therapy Treatment Patient Details Name: BRYAH OCHELTREE MRN: [id number] DOB: 1921-05-10 Today's Date: 04/23/2012 Time: 7829-5621 PT Time Calculation (min): 31 min  PT Assessment / Plan / Recommendation Comments on Treatment Session  Pt demonstartes intermittent cognition and requires increased time to orient to task and + 2 assist for mobility.  Pt plans to D/C to SNF.    Follow Up Recommendations  SNF     Does the patient have the potential to tolerate intense rehabilitation     Barriers to Discharge        Equipment Recommendations       Recommendations for Other Services    Frequency Min 3X/week   Plan Discharge plan remains appropriate    Precautions / Restrictions Precautions Precautions: Fall Precaution Comments: hx CVA, dementia Restrictions Weight Bearing Restrictions: Yes LLE Weight Bearing: Partial weight bearing LLE Partial Weight Bearing Percentage or Pounds: PWB for amb and WBAT for transfers    Pertinent Vitals/Pain C/o a lot pain during activity    Mobility  Bed Mobility Bed Mobility: Supine to Sit;Sitting - Scoot to [place] of Bed Rolling Right: 1: +2 Total assist Rolling Right: Patient Percentage: 10% Sitting - Scoot to Edge of Bed: 1: +2 Total assist Sitting - Scoot to Edge of Bed: Patient Percentage: 0% Details for Bed Mobility Assistance: HOB elevated 50' and + 2 total assist with MAX cueing to decrease fear and orient pt to task Transfers Transfers: Sit to Stand;Stand to Sit Sit to Stand: 1: +2 Total assist;From bed;From elevated surface Sit to Stand: Patient Percentage: 30% Stand to Sit: 1: +2 Total assist Stand to Sit: Patient Percentage: 30% Details for Transfer Assistance: Attempted sit to stand and amb using an EVA walker for increased support.  Pt still required + 2 total assist with much fear/co pain and demonstarted poor ability to support self with B hips and knee buckling.  Ambulation/Gait Ambulation/Gait Assistance: 1: +2  Total assist Ambulation/Gait: Patient Percentage: 20% Ambulation Distance (Feet): 2 Feet Assistive device: Eva walker Ambulation/Gait Assistance Details: + 2 total assist with EVA walker for increased support, very limited tolerance/distance as pt was somewhat resistant/fearful/c/o pain.  Pt repeated, "I can't I need to sit down". Gait Pattern: Step-to pattern Gait velocity: decreased     PT Goals    Visit Information  Last PT Received On: 04/23/12    Subjective Data  Subjective: I have to pick up the flowers Patient Stated Goal: n/a   Cognition       Balance     End of Session PT - End of Session Equipment Utilized During Treatment: Gait belt;Oxygen Activity Tolerance: Patient limited by fatigue;Patient limited by pain Patient left: in chair;with call bell/phone within reach   Felecia Shelling  PTA [facility]  Acute  Rehab Pager     [phone]

## 2012-04-24 LAB — BASIC METABOLIC PANEL
BUN: 8 mg/dL (ref 6–23)
Chloride: 95 mEq/L — ABNORMAL LOW (ref 96–112)
GFR calc Af Amer: 90 mL/min — ABNORMAL LOW (ref >90)
GFR calc non Af Amer: 78 mL/min — ABNORMAL LOW (ref >90)
Potassium: 3.1 mEq/L — ABNORMAL LOW (ref 3.5–5.1)
Sodium: 135 mEq/L (ref 135–145)

## 2012-04-24 LAB — CBC
HCT: 37.1 % (ref 36.0–46.0)
RDW: 13.1 % (ref 11.5–15.5)
WBC: 16.9 10*3/uL — ABNORMAL HIGH (ref 4.0–10.5)

## 2012-04-24 LAB — GLUCOSE, CAPILLARY: Glucose-Capillary: 106 mg/dL — ABNORMAL HIGH (ref 70–99)

## 2012-04-24 MED ORDER — POTASSIUM CHLORIDE CRYS ER 20 MEQ PO TBCR
60.0000 meq | EXTENDED_RELEASE_TABLET | Freq: Two times a day (BID) | ORAL | Status: DC
  Administered 2012-04-24 – 2012-04-25 (×4): 60 meq via ORAL
  Filled 2012-04-24 (×6): qty 3

## 2012-04-24 MED ORDER — POTASSIUM CHLORIDE 10 MEQ/100ML IV SOLN
10.0000 meq | INTRAVENOUS | Status: AC
  Administered 2012-04-24 (×2): 10 meq via INTRAVENOUS
  Filled 2012-04-24 (×2): qty 100

## 2012-04-24 MED ORDER — FUROSEMIDE 40 MG PO TABS: 40.0000 mg | ORAL_TABLET | Freq: Every day | ORAL | Status: DC

## 2012-04-24 MED ORDER — LOPERAMIDE HCL 2 MG PO CAPS: 2.0000 mg | ORAL_CAPSULE | ORAL | Status: DC | PRN

## 2012-04-24 MED ORDER — FUROSEMIDE 10 MG/ML IJ SOLN
40.0000 mg | Freq: Once | INTRAMUSCULAR | Status: AC
  Administered 2012-04-24: 40 mg via INTRAVENOUS
  Filled 2012-04-24: qty 4

## 2012-04-24 MED ORDER — FUROSEMIDE 10 MG/ML IJ SOLN
40.0000 mg | Freq: Every day | INTRAMUSCULAR | Status: DC
  Administered 2012-04-24 – 2012-04-25 (×2): 40 mg via INTRAVENOUS
  Filled 2012-04-24 (×3): qty 4

## 2012-04-24 NOTE — Progress Notes (Signed)
Subjective: 7 Days Post-Op Procedure(s) (LRB): INTRAMEDULLARY (IM) NAIL FEMORAL (Left) Awake, alert, sitting up in bed. Assistance with feeding.  Patient reports pain as mild.    Objective:   VITALS:  Temp:  [97.9 F (36.6 C)-98.7 F (37.1 C)] 98.2 F (36.8 C) (12/10 8413) Pulse Rate:  [91-119] 119  (12/10 0608) Resp:  [16-20] 16  (12/10 0608) BP: (110-139)/(58-81) 110/74 mmHg (12/10 0608) SpO2:  [92 %-96 %] 93 % (12/10 2440)  Neurologically intact ABD soft Sensation intact distally Intact pulses distally Dorsiflexion/Plantar flexion intact Incision: moderate drainage and left upper two incisions clear serous c/w fluid overload No cellulitis present   LABS  Basename 04/24/12 0430 04/23/12 0435 04/22/12 0529  HGB 12.2 11.6* 11.0*  WBC 16.9* 15.0* --  PLT 325 281 --    Basename 04/24/12 0430 04/23/12 0435  NA 135 137  K 3.1* 3.5  CL 95* 102  CO2 30 25  BUN 8 10  CREATININE 0.62 0.61  GLUCOSE 117* 113*   No results found for this basename: LABPT:2,INR:2 in the last 72 hours   Assessment/Plan: 7 Days Post-Op Procedure(s) (LRB): INTRAMEDULLARY (IM) NAIL FEMORAL (Left)  Advance diet Up with therapy Discharge to SNF May be bed to chair and bed to wheelchair transfers, with hoyer if necessary  NITKA,JAMES E 04/24/2012, 9:09 AM

## 2012-04-24 NOTE — Progress Notes (Signed)
[facility] PROGRESS NOTE  Amber Love UJW:[id number] DOB: 08-22-1920 DOA: 04/14/2012 PCP: Josue Hector, MD  [facility] nursing home.  [phone],   Assessment/Plan:  Acute respiratory failure with hypoxia and tachypnea:   Likely due to volume overload after resuscitation for hypoTN and some ATN.  May have pneumonia, but does not have fever, increased tachypnea, or other signs of active pneumonia -  More aggressive lasix as bp tolerates pneumonia -  Repeat CXR after more diuresis and if "infiltrate" clears, d/c abx  Leukocytosis, increasing and patient afebrile.  UA and CXR demonstrated possible pneumonia.   -  Spoke with infectious disease who recommended stopping antibiotics and observing for an additional day -  BCx NGTD -  UA with small LE, 7-10 WBC, few bacteria  Normocytic anemia:  May be from post-surgical blood loss versus dilutional from IVF.  Displaced intertrochanteric left femur fx POD 3 s/p intramedullar nail -  OOB, PT/OT -  Appreciate orthopedic assistance -  Partial weight bearing -  Continue xarelto  Acute kidney injury resolved but with persistent oliguria.  UOP improved with lasix Transaminitis from 12/4, likely shock liver:  Trending down and RUQ neg.  NSTEMI:  The highest troponin of 0.88 but has trended down to 0.3.  -  Continue aspirin -  Continue lisinopril and BB at low dose -  No statin at this time secondary to recent transaminitis  PAROXYSMAL ATRIAL FIBRILLATION with persistent tachycardia, trending down.  Not a candidate for chronic anticoagulation due to frequent falls.   -  Continue metoprolol -  Xarelto is for DVT prophylaxis post operatively x 4 weeks, then stop  Acute on Chronic systolic heart failure  Based on 2-D echo done 04/15/2012 with ejection fraction 40-45%  Continue low dose lisinopril and metop  HYPOTHYROIDISM, stable. Continue levothyroxine 100 mg daily  DIET: Healthy haert  ACCESS: PIV  IVF:   OFF PROPH: Xarelto, SCDs   Code Status: DNR  Family Communication: Patient alone, family not at bedside  Disposition Plan:  SNF placement next week  Consultants:  Cardiology  Orthopedics  Other consultants:  SLP evaluation  Physical therapy  Social work for skilled nursing facility placement Procedures:  Intramedullary nailing of left femur 12/3  Antibiotics:  Cefazolin 12/3 Vanc & zosyn 12/4 >>12/6  HPI/Subjective:  Has pain in the left leg.  Denies chest pain, nausea, abdominal pain, shortness of breath.    Objective: Filed Vitals:   04/24/12 0608 04/24/12 0800 04/24/12 1200 04/24/12 1425  BP: 110/74   120/77  Pulse: 119   94  Temp: 98.2 F (36.8 C)   97.6 F (36.4 C)  TempSrc:    Oral  Resp: 16 16 16 18   Height:      Weight:      SpO2: 93%   98%    Intake/Output Summary (Last 24 hours) at 04/24/12 1635 Last data filed at 04/24/12 1623  Gross per 24 hour  Intake   1485 ml  Output   3425 ml  Net  -1940 ml   Filed Weights   04/18/12 0400 04/20/12 0000 04/23/12 0500  Weight: 68.8 kg (151 lb 10.8 oz) 77.2 kg (170 lb 3.1 oz) 82.1 kg (181 lb)    Exam:  General: Caucasian female, no respiratory distress, nasal canula in place Cardiovascular: Irregularly irregular, 2/6 murmur at apex, no rubs or gallops  Respiratory:  Rales in dependent areas Abdomen: NABS, soft, nontender, nondistended Extremities: Anasarca.  Also stable swelling of the left lateral leg but  less TTP.  Psych: Alert and oriented to person today.  Able to answer some questions.   Data Reviewed: Basic Metabolic Panel:  Lab 04/24/12 4098 04/23/12 0435 04/22/12 0529 04/21/12 0645 04/20/12 0444 04/19/12 0337 04/18/12 1457  NA 135 137 138 137 139 -- --  K 3.1* 3.5 4.0 3.7 4.1 -- --  CL 95* 102 104 105 111 -- --  CO2 30 25 26 23 20  -- --  GLUCOSE 117* 113* 105* 120* 170* -- --  BUN 8 10 15 18  27* -- --  CREATININE 0.62 0.61 0.67 0.76 0.83 -- --  CALCIUM 8.2* 8.2* 8.2* 8.2* 8.3* -- --  MG --  -- -- -- -- -- 2.0  PHOS -- -- -- -- -- 1.9* 2.9   Liver Function Tests:  Lab 04/22/12 0529 04/21/12 0645 04/18/12 1457  AST 72* 77* 301*  ALT 60* 64* 134*  ALKPHOS 78 75 61  BILITOT 1.5* 1.4* 0.7  PROT 5.7* 5.4* 5.3*  ALBUMIN 2.2* 2.0* 2.0*   No results found for this basename: LIPASE:5,AMYLASE:5 in the last 168 hours No results found for this basename: AMMONIA:5 in the last 168 hours CBC:  Lab 04/24/12 0430 04/23/12 0435 04/22/12 0529 04/21/12 0645 04/20/12 0444 04/18/12 1457  WBC 16.9* 15.0* 14.4* 15.8* 16.5* --  NEUTROABS -- -- -- -- -- 9.4*  HGB 12.2 11.6* 11.0* 11.1* 11.4* --  HCT 37.1 35.0* 33.6* 33.6* 33.7* --  MCV 93.0 93.8 94.6 93.1 92.3 --  PLT 325 281 235 204 170 --   Cardiac Enzymes:  Lab 04/18/12 1457  CKTOTAL --  CKMB --  CKMBINDEX --  TROPONINI 0.34*   BNP (last 3 results) No results found for this basename: PROBNP:3 in the last 8760 hours CBG:  Lab 04/24/12 0740 04/23/12 2214 04/23/12 1816 04/23/12 1352 04/23/12 0709  GLUCAP 106* 113* 124* 103* 102*    Recent Results (from the past 240 hour(s))  MRSA PCR SCREENING     Status: Normal   Collection Time   04/14/12  9:06 PM      Component Value Range Status Comment   MRSA by PCR NEGATIVE  NEGATIVE Final   URINE CULTURE     Status: Normal   Collection Time   04/18/12  8:20 AM      Component Value Range Status Comment   Specimen Description URINE, CLEAN CATCH   Final    Special Requests NONE   Final    Culture  Setup Time 04/18/2012 14:14   Final    Colony Count NO GROWTH   Final    Culture NO GROWTH   Final    Report Status 04/19/2012 FINAL   Final   CULTURE, BLOOD (ROUTINE X 2)     Status: Normal   Collection Time   04/18/12  2:55 PM      Component Value Range Status Comment   Specimen Description BLOOD RIGHT ARM   Final    Special Requests BOTTLES DRAWN AEROBIC ONLY [facility]   Final    Culture  Setup Time 04/18/2012 21:59   Final    Culture     Final    Value:        BLOOD CULTURE RECEIVED NO  GROWTH TO DATE CULTURE WILL BE HELD FOR 5 DAYS BEFORE ISSUING A FINAL NEGATIVE REPORT   Report Status 04/24/2012 FINAL   Final   CULTURE, BLOOD (ROUTINE X 2)     Status: Normal   Collection Time   04/18/12  3:01 PM  Component Value Range Status Comment   Specimen Description BLOOD RIGHT HAND   Final    Special Requests BOTTLES DRAWN AEROBIC ONLY [facility]   Final    Culture  Setup Time 04/18/2012 21:59   Final    Culture     Final    Value:        BLOOD CULTURE RECEIVED NO GROWTH TO DATE CULTURE WILL BE HELD FOR 5 DAYS BEFORE ISSUING A FINAL NEGATIVE REPORT   Report Status 04/24/2012 FINAL   Final      Studies: Dg Chest Port 1 View  04/23/2012  *RADIOLOGY REPORT*  Clinical Data: Elevated white blood cell, shortness of breath  CHEST - 1 VIEW  Comparison: Portable chest x-ray of 04/18/2012  Findings: There is more patchy opacity in the right upper lung field which may represent pneumonia superimposed on mild congestive heart failure.  Moderate cardiomegaly remains and a permanent pacemaker is unchanged.  There do appear to be small effusions present.  IMPRESSION: Changes of CHF with possible pneumonia superimposed in the right upper lobe.  Recommend follow-up.   Original Report Authenticated By: Dwyane Dee, M.D.     Scheduled Meds:    . antiseptic oral rinse  15 mL Mouth Rinse q12n4p  . aspirin EC  81 mg Oral Daily  . chlorhexidine  15 mL Mouth Rinse BID  . docusate sodium  100 mg Oral BID  . escitalopram  10 mg Oral QPM  . [COMPLETED] furosemide  40 mg Intravenous Once  . furosemide  40 mg Oral Daily  . levofloxacin  750 mg Intravenous Q24H  . levothyroxine  100 mcg Oral QAC breakfast  . lisinopril  2.5 mg Oral Daily  . [COMPLETED] potassium chloride  10 mEq Intravenous Q1 Hr x 2  . potassium chloride  60 mEq Oral BID  . rivaroxaban  10 mg Oral Daily  . sodium chloride  3 mL Intravenous Q12H  . [DISCONTINUED] furosemide  20 mg Oral Daily   Continuous Infusions:    Principal  Problem:  *Displaced intertrochanteric fracture of left femur Active Problems:  HYPOTHYROIDISM  HYPERTENSION  PAROXYSMAL ATRIAL FIBRILLATION  Chronic diastolic heart failure  Pacemaker  Falls  NSTEMI (non-ST elevated myocardial infarction)  Leukocytosis  Anemia  Hypotension  Acute respiratory failure with hypoxia  Anasarca    Time spent: 30    Amber Love, [facility]  [facility] Pager [phone]. If 8PM-8AM, please contact night-coverage at [email or web address], password [facility] 04/24/2012, 4:35 PM  LOS: 10 days

## 2012-04-24 NOTE — Progress Notes (Signed)
OT Note:  Attempted tx.  Pt very sleepy this am, and I could not keep her aroused.  Will check back another time.  Aldan, OTR/L [phone] 04/24/2012

## 2012-04-24 NOTE — Progress Notes (Signed)
Discussed in long length of stay meeting. 

## 2012-04-25 ENCOUNTER — Inpatient Hospital Stay (HOSPITAL_COMMUNITY): Payer: Medicare Other

## 2012-04-25 DIAGNOSIS — I5020 Unspecified systolic (congestive) heart failure: Secondary | ICD-10-CM

## 2012-04-25 LAB — BASIC METABOLIC PANEL
Chloride: 96 mEq/L (ref 96–112)
Creatinine, Ser: 0.79 mg/dL (ref 0.50–1.10)
GFR calc Af Amer: 83 mL/min — ABNORMAL LOW (ref >90)
GFR calc non Af Amer: 72 mL/min — ABNORMAL LOW (ref >90)

## 2012-04-25 LAB — URINE CULTURE: Culture: NO GROWTH

## 2012-04-25 LAB — CBC
MCV: 94.5 fL (ref 78.0–100.0)
Platelets: 288 10*3/uL (ref 150–400)
RDW: 13.6 % (ref 11.5–15.5)
WBC: 17.3 10*3/uL — ABNORMAL HIGH (ref 4.0–10.5)

## 2012-04-25 MED ORDER — ENSURE COMPLETE PO LIQD
237.0000 mL | Freq: Every day | ORAL | Status: DC
  Administered 2012-04-25: 237 mL via ORAL

## 2012-04-25 MED ORDER — ENSURE PUDDING PO PUDG
1.0000 | Freq: Three times a day (TID) | ORAL | Status: DC
  Administered 2012-04-25: 1 via ORAL
  Filled 2012-04-25 (×4): qty 1

## 2012-04-25 NOTE — Progress Notes (Signed)
INITIAL ADULT NUTRITION ASSESSMENT Date: 04/25/2012   Time: 5:00 PM Reason for Assessment: low braden  INTERVENTION: 1.  Supplements; will order Ensure Complete once daily for pt.  Will also order Ensure pudding for medication administrations.   DOCUMENTATION CODES Per approved criteria  -Not Applicable    ASSESSMENT: Female 78 y.o.  Dx: Displaced intertrochanteric fracture of left femur  Hx:  Past Medical History  Diagnosis Date  . Bladder cancer   . CVA (cerebral infarction)   . Hyperthyroidism   . Bradycardia   . Paroxysmal atrial fibrillation   . Hypertension    Past Surgical History  Procedure Date  . Pacemaker insertion     medtronic  . Femur im nail 04/17/2012    Procedure: INTRAMEDULLARY (IM) NAIL FEMORAL;  Surgeon: Kerrin Champagne, MD;  Location: [facility]S;  Service: Orthopedics;  Laterality: Left;    Related Meds:  Scheduled Meds:   . antiseptic oral rinse  15 mL Mouth Rinse q12n4p  . aspirin EC  81 mg Oral Daily  . chlorhexidine  15 mL Mouth Rinse BID  . docusate sodium  100 mg Oral BID  . escitalopram  10 mg Oral QPM  . furosemide  40 mg Intravenous Daily  . [COMPLETED] furosemide  40 mg Intravenous Once  . levothyroxine  100 mcg Oral QAC breakfast  . lisinopril  2.5 mg Oral Daily  . potassium chloride  60 mEq Oral BID  . rivaroxaban  10 mg Oral Daily  . sodium chloride  3 mL Intravenous Q12H  . [DISCONTINUED] levofloxacin  750 mg Intravenous Q24H   Continuous Infusions:  PRN Meds:.acetaminophen, acetaminophen, alum & mag hydroxide-simeth, bisacodyl, HYDROcodone-acetaminophen, LORazepam, menthol-cetylpyridinium, ondansetron (ZOFRAN) IV, ondansetron, phenol, polyethylene glycol, [DISCONTINUED] loperamide   Ht: 5\' 4"  (162.6 cm)  Wt: 173 lb 8 oz (78.7 kg)  Ideal Wt: 120 lbs % Ideal Wt: 144%  Usual Wt: 135-145 lbs % Usual Wt: 130%  Body mass index is 29.78 kg/(m^2).  Food/Nutrition Related Hx: unable to assess- pt poor historian  Labs:   CMP     Component Value Date/Time   NA 135 04/25/2012 0425   K 4.1 04/25/2012 0425   CL 96 04/25/2012 0425   CO2 32 04/25/2012 0425   GLUCOSE 99 04/25/2012 0425   BUN 9 04/25/2012 0425   CREATININE 0.79 04/25/2012 0425   CALCIUM 8.4 04/25/2012 0425   PROT 5.7* 04/22/2012 0529   ALBUMIN 2.2* 04/22/2012 0529   AST 72* 04/22/2012 0529   ALT 60* 04/22/2012 0529   ALKPHOS 78 04/22/2012 0529   BILITOT 1.5* 04/22/2012 0529   GFRNONAA 72* 04/25/2012 0425   GFRAA 83* 04/25/2012 0425    CBC    Component Value Date/Time   WBC 17.3* 04/25/2012 0425   RBC 3.80* 04/25/2012 0425   HGB 11.5* 04/25/2012 0425   HCT 35.9* 04/25/2012 0425   PLT 288 04/25/2012 0425   MCV 94.5 04/25/2012 0425   MCH 30.3 04/25/2012 0425   MCHC 32.0 04/25/2012 0425   RDW 13.6 04/25/2012 0425   LYMPHSABS 1.3 04/18/2012 1457   MONOABS 1.1* 04/18/2012 1457   EOSABS 0.0 04/18/2012 1457   BASOSABS 0.0 04/18/2012 1457    Intake: 0-10% Output:   Intake/Output Summary (Last 24 hours) at 04/25/12 1701 Last data filed at 04/25/12 1400  Gross per 24 hour  Intake    127 ml  Output   3550 ml  Net  -3423 ml   1 BM yesterday  Diet Order: Dysphagia 3, thin  Supplements/Tube Feeding: none at this time  IVF:    Estimated Nutritional Needs:   Kcal: 1530-1890 Protein: 56-65g Fluid: per MD discretion, 1200 mL fluid restriction  Pt admitted after hip fx s/p surgery.  Pt has not been eating well since admission, PO intake 0-10%.  Pt is feeding with assistance but often states "I don't have an appetite."   Pt is appropriate but forgetful during visit with RD.  She knows she hasn't been eating well, but also states "i've been eating all I want."  Pt's with dry mouth, constantly smacking lips during visit.  RD offered sips of soda at beside and pt drank 1/3c of soda.  NT reports this is pt's favorite item to consume at the moment.  RN confirms poor appetite and intake.  NUTRITION DIAGNOSIS: -Inadequate oral intake (NI-2.1).   Status: Ongoing  RELATED TO: dementia, decreased appetite  AS EVIDENCE BY: PO 0-10%  MONITORING/EVALUATION(Goals): 1.  Food/Beverage; pt to improve intake to >/=50% of meals  EDUCATION NEEDS: -No education needs identified at this time    Loyce Dys, MS RD LDN Clinical Inpatient Dietitian Pager: [phone] Weekend/After hours pager: [phone]

## 2012-04-25 NOTE — Progress Notes (Signed)
[facility] PROGRESS NOTE  LUE SYKORA YNW:[id number] DOB: April 04, 1921 DOA: 04/14/2012 PCP: Josue Hector, MD  Assessment/Plan: 1. S/p acute respiratory failure--resolved, thought secondary to volume overload after resuscitation for hypoTN and some ATN. Repeat CXR with improvement consistent with previous MD's impression. Clinically stable without abx, doubt pneumonia. Continue Lasix. 2. Anemia of acute illness--stable. 3. Hip fracture--s/p surgery, continue Xarelto. 4. Transaminitis--thought secondary to shock liver. 5. NSTEMI--stable, continue ASA, lisinopril, beta blocker. 6. PAF--Xarelto for DVT prophylaxis x4 weeks per orthopedics, then stop. History of fall risk. 7. Acute/chronic systolic CHF--continue Lasix. Consider beta blocker.  Code Status: DNR Family Communication:  Disposition Plan: SNF  Brendia Sacks, MD  [facility] Team 6 Pager [phone] If 8PM-8AM, please contact night-coverage at [email or web address], password [facility] 04/25/2012, 8:14 PM  LOS: 11 days   HPI/Subjective: No complaints.  Objective: Filed Vitals:   04/25/12 0629 04/25/12 0640 04/25/12 1318 04/25/12 2000  BP: 90/59 102/68 103/70   Pulse: 63 74 69   Temp: 99 F (37.2 C)  98.4 F (36.9 C)   TempSrc: Oral  Oral   Resp: 14  14 16   Height:      Weight: 78.7 kg (173 lb 8 oz)     SpO2: 99%  99% 98%    Intake/Output Summary (Last 24 hours) at 04/25/12 2014 Last data filed at 04/25/12 1743  Gross per 24 hour  Intake    123 ml  Output   3550 ml  Net  -3427 ml   Filed Weights   04/20/12 0000 04/23/12 0500 04/25/12 0629  Weight: 77.2 kg (170 lb 3.1 oz) 82.1 kg (181 lb) 78.7 kg (173 lb 8 oz)    Exam:  General:  Appears calm and comfortable Cardiovascular: RRR, no m/r/g. No LE edema. Respiratory: CTA bilaterally, no w/r/r. Normal respiratory effort. Psychiatric: grossly normal mood and affect, speech fluent and appropriate  Data Reviewed: Basic Metabolic Panel:  Lab  04/25/12 0425 04/24/12 0430 04/23/12 0435 04/22/12 0529 04/21/12 0645 04/19/12 0337  NA 135 135 137 138 137 --  K 4.1 3.1* 3.5 4.0 3.7 --  CL 96 95* 102 104 105 --  CO2 32 30 25 26 23  --  GLUCOSE 99 117* 113* 105* 120* --  BUN 9 8 10 15 18  --  CREATININE 0.79 0.62 0.61 0.67 0.76 --  CALCIUM 8.4 8.2* 8.2* 8.2* 8.2* --  MG -- -- -- -- -- --  PHOS -- -- -- -- -- 1.9*   Liver Function Tests:  Lab 04/22/12 0529 04/21/12 0645  AST 72* 77*  ALT 60* 64*  ALKPHOS 78 75  BILITOT 1.5* 1.4*  PROT 5.7* 5.4*  ALBUMIN 2.2* 2.0*   CBC:  Lab 04/25/12 0425 04/24/12 0430 04/23/12 0435 04/22/12 0529 04/21/12 0645  WBC 17.3* 16.9* 15.0* 14.4* 15.8*  NEUTROABS -- -- -- -- --  HGB 11.5* 12.2 11.6* 11.0* 11.1*  HCT 35.9* 37.1 35.0* 33.6* 33.6*  MCV 94.5 93.0 93.8 94.6 93.1  PLT 288 325 281 235 204   CBG:  Lab 04/24/12 0740 04/23/12 2214 04/23/12 1816 04/23/12 1352 04/23/12 0709  GLUCAP 106* 113* 124* 103* 102*    Recent Results (from the past 240 hour(s))  URINE CULTURE     Status: Normal   Collection Time   04/18/12  8:20 AM      Component Value Range Status Comment   Specimen Description URINE, CLEAN CATCH   Final    Special Requests NONE   Final    Culture  Setup  Time 04/18/2012 14:14   Final    Colony Count NO GROWTH   Final    Culture NO GROWTH   Final    Report Status 04/19/2012 FINAL   Final   CULTURE, BLOOD (ROUTINE X 2)     Status: Normal   Collection Time   04/18/12  2:55 PM      Component Value Range Status Comment   Specimen Description BLOOD RIGHT ARM   Final    Special Requests BOTTLES DRAWN AEROBIC ONLY [facility]   Final    Culture  Setup Time 04/18/2012 21:59   Final    Culture     Final    Value:        BLOOD CULTURE RECEIVED NO GROWTH TO DATE CULTURE WILL BE HELD FOR 5 DAYS BEFORE ISSUING A FINAL NEGATIVE REPORT   Report Status 04/24/2012 FINAL   Final   CULTURE, BLOOD (ROUTINE X 2)     Status: Normal   Collection Time   04/18/12  3:01 PM      Component Value Range  Status Comment   Specimen Description BLOOD RIGHT HAND   Final    Special Requests BOTTLES DRAWN AEROBIC ONLY [facility]   Final    Culture  Setup Time 04/18/2012 21:59   Final    Culture     Final    Value:        BLOOD CULTURE RECEIVED NO GROWTH TO DATE CULTURE WILL BE HELD FOR 5 DAYS BEFORE ISSUING A FINAL NEGATIVE REPORT   Report Status 04/24/2012 FINAL   Final   URINE CULTURE     Status: Normal   Collection Time   04/23/12  5:43 PM      Component Value Range Status Comment   Specimen Description URINE, CLEAN CATCH   Final    Special Requests NONE   Final    Culture  Setup Time 04/24/2012 03:06   Final    Colony Count NO GROWTH   Final    Culture NO GROWTH   Final    Report Status 04/25/2012 FINAL   Final      Studies: Dg Chest 2 View  04/25/2012  *RADIOLOGY REPORT*  Clinical Data: Pneumonia, follow-up  CHEST - 2 VIEW  Comparison: Portable chest x-ray of 04/23/2012  Findings: There has been some improvement in airspace disease with residual airspace disease in the upper lobes right greater than left, most consistent with pneumonia.  There is cardiomegaly present with small bilateral pleural effusions.  A permanent pacemaker remains.  The bones are osteopenic.  IMPRESSION:  1.  Improvement in airspace disease with visual opacities in both upper lobes. 2.  Cardiomegaly with bilateral pleural effusions.   Original Report Authenticated By: Dwyane Dee, M.D.     Scheduled Meds:   . antiseptic oral rinse  15 mL Mouth Rinse q12n4p  . aspirin EC  81 mg Oral Daily  . chlorhexidine  15 mL Mouth Rinse BID  . docusate sodium  100 mg Oral BID  . escitalopram  10 mg Oral QPM  . feeding supplement  237 mL Oral Q1500  . feeding supplement  1 Container Oral TID BM  . furosemide  40 mg Intravenous Daily  . levothyroxine  100 mcg Oral QAC breakfast  . lisinopril  2.5 mg Oral Daily  . potassium chloride  60 mEq Oral BID  . rivaroxaban  10 mg Oral Daily  . sodium chloride  3 mL Intravenous Q12H    Continuous Infusions:  Principal Problem:  *Displaced intertrochanteric fracture of left femur Active Problems:  PAROXYSMAL ATRIAL FIBRILLATION  Chronic diastolic heart failure  Falls  NSTEMI (non-ST elevated myocardial infarction)  Leukocytosis  Anemia  Acute respiratory failure with hypoxia     Brendia Sacks, MD  [facility] Team 6 Pager [phone] If 8PM-8AM, please contact night-coverage at [email or web address], password [facility] 04/25/2012, 8:14 PM  LOS: 11 days   Time spent: 30 minutes

## 2012-04-26 LAB — CBC
HCT: 35.3 % — ABNORMAL LOW (ref 36.0–46.0)
Platelets: 295 10*3/uL (ref 150–400)
RDW: 13.4 % (ref 11.5–15.5)
WBC: 15.7 10*3/uL — ABNORMAL HIGH (ref 4.0–10.5)

## 2012-04-26 LAB — BASIC METABOLIC PANEL
BUN: 10 mg/dL (ref 6–23)
Chloride: 97 mEq/L (ref 96–112)
GFR calc Af Amer: 68 mL/min — ABNORMAL LOW (ref >90)
Potassium: 5.2 mEq/L — ABNORMAL HIGH (ref 3.5–5.1)
Sodium: 135 mEq/L (ref 135–145)

## 2012-04-26 MED ORDER — ACETAMINOPHEN 325 MG PO TABS: 650.0000 mg | ORAL_TABLET | Freq: Four times a day (QID) | ORAL | Status: AC | PRN

## 2012-04-26 MED ORDER — ASPIRIN EC 325 MG PO TBEC: 325.0000 mg | DELAYED_RELEASE_TABLET | Freq: Every day | ORAL | Status: AC

## 2012-04-26 MED ORDER — ENSURE COMPLETE PO LIQD: 237.0000 mL | Freq: Every day | ORAL | Status: AC

## 2012-04-26 NOTE — Progress Notes (Signed)
Physical Therapy Treatment Patient Details Name: Amber Love MRN: [id number] DOB: 01-10-1921 Today's Date: 04/26/2012 Time: 4098-1191 PT Time Calculation (min): 25 min  PT Assessment / Plan / Recommendation Comments on Treatment Session   POD # 9 L IM nail 2nd fall/fx.  Family in room and planning to take pt home in a w/c.  They plan to provide 24/7 care and have all equipment.  With daughter, assisted pt OOB to amb then positioned in [facility]. Family educated on proper handling tech and PWB precautions.    Follow Up Recommendations        Does the patient have the potential to tolerate intense rehabilitation     Barriers to Discharge        Equipment Recommendations       Recommendations for Other Services    Frequency     Plan      Precautions / Restrictions Precautions Precautions: Fall Precaution Comments: hx CVA, dementia  Restrictions Weight Bearing Restrictions: Yes LLE Weight Bearing: Partial weight bearing Other Position/Activity Restrictions: WBAT for transfers and 50% PWB if ambulates    Pertinent Vitals/Pain C/o "alot" pain during amb    Mobility  Bed Mobility Bed Mobility: Supine to Sit Supine to Sit: 1: +2 Total assist Details for Bed Mobility Assistance: HOB elevated 50' and + 2 total assist with MAX cueing to decrease fear and orient pt to task  Transfers Transfers: Sit to Stand;Stand to Sit Sit to Stand: From bed;From elevated surface;1: +2 Total assist Sit to Stand: Patient Percentage: 30% Stand to Sit: 1: +2 Total assist (W/C) Stand to Sit: Patient Percentage: 30% Details for Transfer Assistance: Attempted sit to stand and amb using an EVA walker for increased support. Pt still required + 2 total assist with much fear/co pain and demonstarted poor ability to support self with B hips and knee buckling Ambulation/Gait Ambulation/Gait Assistance: 1: +2 Total assist Ambulation/Gait: Patient Percentage: 30% Ambulation Distance (Feet): 15 Feet Assistive  device: Eva walker Ambulation/Gait Assistance Details: daughter assisted with amb using EVA walker limited distance but increased distance from the day before. Pt required MAX encouragement to increase amb distnace.  Gait Pattern: Step-to pattern;Decreased stance time - left;Narrow base of support;Trunk flexed;Shuffle Gait velocity: decreased     PT Goals                                                   progressing    Visit Information  Last PT Received On: 04/26/12 Assistance Needed: +2    Subjective Data      Cognition    impaired   Balance   poor  End of Session  left pt in [facility]  With family in room.   Felecia Shelling  PTA [facility]  Acute  Rehab Pager     [phone]

## 2012-04-26 NOTE — Progress Notes (Signed)
CSW met with pt/daughter 12/11 & 12/12 to assist with d/c planning. [facility]  has no openings available for this pt following hospital d/c. Pt/daughter decline to choose another SNF. Pt plans to return home . RNCM will assist with d/c planning needs. Daughter has arranged transport home for pt.  Cori Razor LCSW [phone]

## 2012-04-26 NOTE — Discharge Summary (Signed)
Physician Discharge Summary  CORITA ALLINSON NWG:[id number] DOB: 22-Sep-1920 DOA: 04/14/2012  PCP: Josue Hector, MD  Admit date: 04/14/2012 Discharge date: 04/26/2012  Recommendations for Outpatient Follow-up:  1. Follow-up PT/OT/RN for hip fracture, HH (patient/family refused SNF) 2. Follow-up NSTEMI, systolic CHF 3. Consider restarting beta-blocker if blood pressure can tolerate. Consider adding ACE-I if blood pressure can tolerate 4. Follow-up hip fracture 5. Consider repeat BMP 2-7 days  Follow-up Information    Follow up with NITKA,JAMES E, MD. In 1 week.   Contact information:   [address] [place] [address] [phone]       Follow up with Josue Hector, MD. In 4 days.   Contact information:   [address] [facility] [address] [phone]       Follow up with Lewayne Bunting, MD. In 4 weeks.   Contact information:   [address] [place] [address] [phone]         Discharge Diagnoses:  1. Hip fracture 2. NSTEMI 3. Acute/chronic systolic CHF 4. S/p acute respiratory failure 5. Anemia of acute illness 6. Transaminitis 7. PAF/pacemaker 8. Dementia  Discharge Condition: improved Disposition: home with HHPT/OT/RN  Diet recommendation: heart healthy  Filed Weights   04/20/12 0000 04/23/12 0500 04/25/12 0629  Weight: 77.2 kg (170 lb 3.1 oz) 82.1 kg (181 lb) 78.7 kg (173 lb 8 oz)    History of present illness:  78 year old woman presented to ED s/p mechanical fall at home with subsequent left hip pain. Xrays confirmed hip fracture and patient was referred for admission.  Hospital Course:  Ms. Lama was also noted to have an elevated troponin with EKG changes on admission. She was seen by cardiology for preoperative clearance with assessment including NSTEMI, thought secondary to stress of fall. Medical management was recommended including statin, beta-blocker. Surgery was delayed to await clinical  improvement. Surgery was performed 12/3 and patient was hypotensive following thought secondary to hypovolemia. Further decompensated with acute respiratory failure requiring increased oxygen, most likely secondary to volume resuscitation. Condition slowly improved with diuresis, oxygen was weaned and BP stabilized, however has been low normal and patient has not been able to tolerate beta blocker or ACE-I at this point. Mild leukocytosis persists, but UC was negative and repeat CXR was consistent with resolving edema based on history. No evidence of pneumonia. SNF was offered but HCPOA/patient refused and plans were made for discharge home. Minimal hyperkalemia was secondary to potassium replacement and held Lasix. Patient can resume Lasix and potassium as outpatient.  1. Hip fracture--s/p surgery. In further discussion with POA, review of Dr. Lubertha Basque and Nahser's notes and history of multiple falls combined with return home instead of SNF, risk/benefit of Xarelto was weighed against ASA and POA desires ASA which I concur with. She understands that DVT/PE post op is a real risk but fall risk is felt to be greater. Regardless, not a long-term anticoagulation candidate. Discussed with Dr. Otelia Sergeant who concurs.  2. NSTEMI--thought to be secondary to stress of fall. Echocardiogram findings were c/w Takotsubo cardiomyopathy (seen with traumatic falls). Stable, continue ASA, BP cannot currently tolerate ACE-I, beta-blocker. Add as outpatient if possible.  3. Acute/chronic systolic CHF--continue Lasix. Consider beta blocker, ACE-I as above. Unknown whether acute or chronic, observe over next several months.  4. S/p acute respiratory failure--resolved, thought secondary to volume overload after resuscitation for hypoTN and some ATN. Repeat CXR with improvement consistent with previous MD's impression. Clinically stable without abx, doubt pneumonia. Continue Lasix.  5. Anemia of acute illness--stable s/p transfusion,  though secondary to operative blood loss.  6. Transaminitis--thought secondary to shock liver.  7. PAF/pacemaker--ASA as above. History of fall risk. Not a candidate for longterm anticoagulation.  8. Dementia  Consultants:  Orthopedics   Cardiology    ST--dysphagia 3 diet, thin liquid   PT--SNF   OT--SNF  Procedures:  12/1 2-D echocardiogram Left ventricle: The cavity size was mildly dilated. Wall thickness was normal. Systolic function was mildly to moderately reduced. The estimated ejection fraction was in the range of 40% to 45%. Akinesis of the mid-distalanteroseptal, inferior, and apical myocardium.  12/3 INTRAMEDULLARY (IM) NAIL FEMORAL (Left)  Discharge Instructions  Discharge Orders    Future Orders Please Complete By Expires   Diet - low sodium heart healthy      Partial weight bearing      Scheduling Instructions:   50% partial weight bearing to left leg and weight bear as tolerated to the right leg   Home Health      Questions: Responses:   To provide the following care/treatments PT    OT    RN   Face-to-face encounter      Comments:   I Brendia Sacks certify that this patient is under my care and that I, or a nurse practitioner or physician's assistant working with me, had a face-to-face encounter that meets the physician face-to-face encounter requirements with this patient on 04/26/2012. The encounter with the patient was in whole, or in part for the following medical condition(s) which is the primary reason for home health care (List medical condition): hip fracture   Questions: Responses:   The encounter with the patient was in whole, or in part, for the following medical condition, which is the primary reason for home health care hip fracture   I certify that, based on my findings, the following services are medically necessary home health services Physical therapy   My clinical findings support the need for the above services Cognitive impairments,  dementia, or mental confusion  that make it unsafe to leave home   Further, I certify that my clinical findings support that this patient is homebound due to: Ambulates short distances less than 300 feet   To provide the following care/treatments PT    OT    RN       Medication List     As of 04/26/2012  1:20 PM    STOP taking these medications         amLODipine 2.5 MG tablet   Commonly known as: NORVASC      LORazepam 0.5 MG tablet   Commonly known as: ATIVAN      metoprolol 50 MG tablet   Commonly known as: LOPRESSOR      XARELTO 20 MG Tabs   Generic drug: Rivaroxaban      TAKE these medications         acetaminophen 325 MG tablet   Commonly known as: TYLENOL   Take 2 tablets (650 mg total) by mouth every 6 (six) hours as needed (or Fever >/= 101).      aspirin EC 325 MG tablet   Take 1 tablet (325 mg total) by mouth daily.      atorvastatin 10 MG tablet   Commonly known as: LIPITOR   Take 10 mg by mouth every morning.      escitalopram 10 MG tablet   Commonly known as: LEXAPRO   Take 10 mg by mouth every evening.  feeding supplement Liqd   Take 237 mLs by mouth daily at 3 pm.      furosemide 40 MG tablet   Commonly known as: LASIX   Take 40 mg by mouth every morning.      levothyroxine 100 MCG tablet   Commonly known as: SYNTHROID, LEVOTHROID   Take 100 mcg by mouth daily before breakfast.      potassium citrate 10 MEQ (1080 MG) SR tablet   Commonly known as: UROCIT-K   Take 10 mEq by mouth every morning. Break tablet in half. Patient with get choked on whole tablet        The results of significant diagnostics from this hospitalization (including imaging, microbiology, ancillary and laboratory) are listed below for reference.    Significant Diagnostic Studies: Dg Chest 2 View  04/25/2012  *RADIOLOGY REPORT*  Clinical Data: Pneumonia, follow-up  CHEST - 2 VIEW  Comparison: Portable chest x-ray of 04/23/2012  Findings: There has been some  improvement in airspace disease with residual airspace disease in the upper lobes right greater than left, most consistent with pneumonia.  There is cardiomegaly present with small bilateral pleural effusions.  A permanent pacemaker remains.  The bones are osteopenic.  IMPRESSION:  1.  Improvement in airspace disease with visual opacities in both upper lobes. 2.  Cardiomegaly with bilateral pleural effusions.   Original Report Authenticated By: Dwyane Dee, M.D.    Dg Hip Complete Left  04/14/2012  *RADIOLOGY REPORT*  Clinical Data: Fall with left hip pain.  LEFT HIP - COMPLETE 2+ VIEW  Comparison: None.  Findings: Acute, comminuted and angulated fracture of the proximal left femur is present with primarily intertrochanteric fracture planes.  There also may be a subcapital component of injury.  No dislocation.  The bony pelvis is intact.  IMPRESSION: Comminuted left hip fracture which appears to be primarily an intertrochanteric fracture.  There may be a subcapital component of fracture as well.   Original Report Authenticated By: Irish Lack, M.D.    Ct Head Wo Contrast  04/14/2012  *RADIOLOGY REPORT*  Clinical Data: Fall and acute mental status changes.  CT HEAD WITHOUT CONTRAST  Technique:  Contiguous axial images were obtained from the base of the skull through the vertex without contrast.  Comparison: Prior head CT on 07/31/2011 at [facility].  Findings: Advanced atrophy and small vessel ischemic changes are stable since the prior study.  The brain demonstrates no evidence of hemorrhage, infarction, edema, mass effect, extra-axial fluid collection, hydrocephalus or mass lesion.  The skull is unremarkable.  IMPRESSION: No acute findings.  Stable atrophy and small vessel disease.   Original Report Authenticated By: Irish Lack, M.D.    US Abdomen Complete  04/22/2012  *RADIOLOGY REPORT*  Clinical Data:  78 year old female with elevated LFTs and bilirubin.  History of bladder  cancer.  ABDOMINAL ULTRASOUND COMPLETE  Comparison:  None.  Findings:  Gallbladder:  The gallbladder is unremarkable. There is no evidence of gallstones, gallbladder wall thickening, or pericholecystic fluid.  Common Bile Duct:  There is no evidence of intrahepatic or extrahepatic biliary dilation. The CBD measures 3.9 mm in greatest diameter.  Liver:  The liver is within normal limits in parenchymal echogenicity. No focal abnormalities are identified.  IVC:  Appears normal.  Pancreas:  Although the pancreas is difficult to visualize in its entirety, no focal pancreatic abnormality is identified.  Spleen:  Within normal limits in size and echotexture.  Right kidney:  The right kidney is normal in size  and parenchymal echogenicity. Cortical thinning is noted.  There is no evidence of solid mass, hydronephrosis or definite renal calculi.  The right kidney measures 10.7 cm.  Left kidney:  The left kidney is normal in size and parenchymal echogenicity. Cortical thinning is noted.  There is no evidence of solid mass, hydronephrosis or definite renal calculi.   The left kidney measures 10.2 cm.  Abdominal Aorta:  No abdominal aortic aneurysm identified. Atherosclerotic plaque is present.  There is no evidence of ascites.  IMPRESSION:  No evidence of acute abnormality.  Unremarkable liver sonographically.  No evidence of biliary dilatation.  Bilateral renal cortical thinning / atrophy.   Original Report Authenticated By: Harmon Pier, M.D.    Microbiology: Recent Results (from the past 240 hour(s))  URINE CULTURE     Status: Normal   Collection Time   04/18/12  8:20 AM      Component Value Range Status Comment   Specimen Description URINE, CLEAN CATCH   Final    Special Requests NONE   Final    Culture  Setup Time 04/18/2012 14:14   Final    Colony Count NO GROWTH   Final    Culture NO GROWTH   Final    Report Status 04/19/2012 FINAL   Final   CULTURE, BLOOD (ROUTINE X 2)     Status: Normal   Collection Time    04/18/12  2:55 PM      Component Value Range Status Comment   Specimen Description BLOOD RIGHT ARM   Final    Special Requests BOTTLES DRAWN AEROBIC ONLY [facility]   Final    Culture  Setup Time 04/18/2012 21:59   Final    Culture     Final    Value:        BLOOD CULTURE RECEIVED NO GROWTH TO DATE CULTURE WILL BE HELD FOR 5 DAYS BEFORE ISSUING A FINAL NEGATIVE REPORT   Report Status 04/24/2012 FINAL   Final   CULTURE, BLOOD (ROUTINE X 2)     Status: Normal   Collection Time   04/18/12  3:01 PM      Component Value Range Status Comment   Specimen Description BLOOD RIGHT HAND   Final    Special Requests BOTTLES DRAWN AEROBIC ONLY [facility]   Final    Culture  Setup Time 04/18/2012 21:59   Final    Culture     Final    Value:        BLOOD CULTURE RECEIVED NO GROWTH TO DATE CULTURE WILL BE HELD FOR 5 DAYS BEFORE ISSUING A FINAL NEGATIVE REPORT   Report Status 04/24/2012 FINAL   Final   URINE CULTURE     Status: Normal   Collection Time   04/23/12  5:43 PM      Component Value Range Status Comment   Specimen Description URINE, CLEAN CATCH   Final    Special Requests NONE   Final    Culture  Setup Time 04/24/2012 03:06   Final    Colony Count NO GROWTH   Final    Culture NO GROWTH   Final    Report Status 04/25/2012 FINAL   Final      Labs: Basic Metabolic Panel:  Lab 04/26/12 4098 04/25/12 0425 04/24/12 0430 04/23/12 0435 04/22/12 0529  NA 135 135 135 137 138  K 5.2* 4.1 3.1* 3.5 4.0  CL 97 96 95* 102 104  CO2 32 32 30 25 26   GLUCOSE 99 99 117* 113*  105*  BUN 10 9 8 10 15   CREATININE 0.85 0.79 0.62 0.61 0.67  CALCIUM 8.4 8.4 8.2* 8.2* 8.2*  MG -- -- -- -- --  PHOS -- -- -- -- --   Liver Function Tests:  Lab 04/22/12 0529 04/21/12 0645  AST 72* 77*  ALT 60* 64*  ALKPHOS 78 75  BILITOT 1.5* 1.4*  PROT 5.7* 5.4*  ALBUMIN 2.2* 2.0*   CBC:  Lab 04/26/12 0410 04/25/12 0425 04/24/12 0430 04/23/12 0435 04/22/12 0529  WBC 15.7* 17.3* 16.9* 15.0* 14.4*  NEUTROABS -- -- -- -- --  HGB  11.2* 11.5* 12.2 11.6* 11.0*  HCT 35.3* 35.9* 37.1 35.0* 33.6*  MCV 95.4 94.5 93.0 93.8 94.6  PLT 295 288 325 281 235   CBG:  Lab 04/24/12 0740 04/23/12 2214 04/23/12 1816 04/23/12 1352 04/23/12 0709  GLUCAP 106* 113* 124* 103* 102*    Principal Problem:  *Displaced intertrochanteric fracture of left femur Active Problems:  PAROXYSMAL ATRIAL FIBRILLATION  Chronic diastolic heart failure  Falls  NSTEMI (non-ST elevated myocardial infarction)  Leukocytosis  Anemia  Acute respiratory failure with hypoxia   Time coordinating discharge: 90 minutes  Signed:  Brendia Sacks, MD [facility] 04/26/2012, 1:20 PM

## 2012-04-26 NOTE — Progress Notes (Signed)
[facility] PROGRESS NOTE  RHEALYN CULLEN UJW:[id number] DOB: 07-13-1920 DOA: 04/14/2012 PCP: Josue Hector, MD Dr. Otelia Sergeant  Assessment/Plan: 1. Hip fracture--s/p surgery. In further discussion with POA, review of Dr. Lubertha Basque and Nahser's notes and history of multiple falls combined with return home instead of SNF, risk/benefit of Xarelto was weighed against ASA and POA desires ASA which I concur with. She understands that DVT/PE post op is a real risk but fall risk is felt to be greater. Regardless, not a long-term anticoagulation candidate. Discussed with Dr. Otelia Sergeant who concurs. 2. NSTEMI--thought to be secondary to stress of fall. Echocardiogram findings were c/w Takotsubo cardiomyopathy (seen with traumatic falls). Stable, continue ASA, BP cannot currently tolerate ACE-I, beta-blocker. Add as outpatient if possible. 3. Acute/chronic systolic CHF--continue Lasix. Consider beta blocker, ACE-I as above. Unknown whether acute or chronic, observe over next several months. 4. S/p acute respiratory failure--resolved, thought secondary to volume overload after resuscitation for hypoTN and some ATN. Repeat CXR with improvement consistent with previous MD's impression. Clinically stable without abx, doubt pneumonia. Continue Lasix. 5. Anemia of acute illness--stable s/p transfusion, though secondary to operative blood loss. 6. Transaminitis--thought secondary to shock liver. 7. PAF/pacemaker--ASA as above. History of fall risk. Not a candidate for longterm anticoagulation. 8. Dementia  Code Status: DNR Family Communication: discussed with POA/granddaughter at bedside Disposition Plan: Refuses SNF. HHRN, PT, and OT. Pt does have lift chair, RW, BSC, hospital bed, and wheelchair at home.  Brendia Sacks, MD  [facility] Team 6 Pager [phone] If 8PM-8AM, please contact night-coverage at [email or web address], password [facility] 04/26/2012, 12:22 PM  LOS: 12 days   Brief  narrative: 78 year old woman presented to ED s/p mechanical fall at home with subsequent left hip pain. Xrays confirmed hip fracture and patient was referred for admission.  Also was noted to have an elevated troponin with EKG changes on admission. She was seen by cardiology for preoperative clearance with assessment including NSTEMI, thought secondary to stress of fall. Medical management was recommended including statin, beta-blocker and Xarelto post-operatively. Surgery was delayed to await clinical improvement. Surgery was performed 12/3 and patient was hypotensive following thought secondary to hypovolemia. Further decompensated with a cute respiratory failure requiring increased oxygen.  Consultants:  Orthopedics  Cardiology   ST--dysphagia 3 diet, thin liquid  PT--SNF  OT--SNF  Procedures:  12/1 2-D echocardiogram Left ventricle: The cavity size was mildly dilated. Wall thickness was normal. Systolic function was mildly to moderately reduced. The estimated ejection fraction was in the range of 40% to 45%. Akinesis of the mid-distalanteroseptal, inferior, and apical myocardium.  12/3 INTRAMEDULLARY (IM) NAIL FEMORAL (Left)  HPI/Subjective: No complaints except some hip pain.  Objective: Filed Vitals:   04/25/12 2110 04/26/12 0000 04/26/12 0400 04/26/12 0641  BP: 90/61   134/59  Pulse: 68   77  Temp: 97.8 F (36.6 C)   98.5 F (36.9 C)  TempSrc: Oral   Oral  Resp: 14 16 16 14   Height:      Weight:      SpO2: 98% 97% 98% 98%    Intake/Output Summary (Last 24 hours) at 04/26/12 1222 Last data filed at 04/26/12 0915  Gross per 24 hour  Intake    123 ml  Output   2001 ml  Net  -1878 ml   Filed Weights   04/20/12 0000 04/23/12 0500 04/25/12 0629  Weight: 77.2 kg (170 lb 3.1 oz) 82.1 kg (181 lb) 78.7 kg (173 lb 8 oz)    Exam:  General:  Appears calm and comfortable Cardiovascular: RRR, no m/r/g. No LE edema. Respiratory: CTA bilaterally, no w/r/r. Normal  respiratory effort. Psychiatric: grossly normal mood and affect, speech fluent and appropriate  Data Reviewed: Basic Metabolic Panel:  Lab 04/26/12 4098 04/25/12 0425 04/24/12 0430 04/23/12 0435 04/22/12 0529  NA 135 135 135 137 138  K 5.2* 4.1 3.1* 3.5 4.0  CL 97 96 95* 102 104  CO2 32 32 30 25 26   GLUCOSE 99 99 117* 113* 105*  BUN 10 9 8 10 15   CREATININE 0.85 0.79 0.62 0.61 0.67  CALCIUM 8.4 8.4 8.2* 8.2* 8.2*  MG -- -- -- -- --  PHOS -- -- -- -- --   CBC:  Lab 04/26/12 0410 04/25/12 0425 04/24/12 0430 04/23/12 0435 04/22/12 0529  WBC 15.7* 17.3* 16.9* 15.0* 14.4*  NEUTROABS -- -- -- -- --  HGB 11.2* 11.5* 12.2 11.6* 11.0*  HCT 35.3* 35.9* 37.1 35.0* 33.6*  MCV 95.4 94.5 93.0 93.8 94.6  PLT 295 288 325 281 235   CBG:  Lab 04/24/12 0740 04/23/12 2214 04/23/12 1816 04/23/12 1352 04/23/12 0709  GLUCAP 106* 113* 124* 103* 102*    Recent Results (from the past 240 hour(s))  URINE CULTURE     Status: Normal   Collection Time   04/18/12  8:20 AM      Component Value Range Status Comment   Specimen Description URINE, CLEAN CATCH   Final    Special Requests NONE   Final    Culture  Setup Time 04/18/2012 14:14   Final    Colony Count NO GROWTH   Final    Culture NO GROWTH   Final    Report Status 04/19/2012 FINAL   Final   CULTURE, BLOOD (ROUTINE X 2)     Status: Normal   Collection Time   04/18/12  2:55 PM      Component Value Range Status Comment   Specimen Description BLOOD RIGHT ARM   Final    Special Requests BOTTLES DRAWN AEROBIC ONLY [facility]   Final    Culture  Setup Time 04/18/2012 21:59   Final    Culture     Final    Value:        BLOOD CULTURE RECEIVED NO GROWTH TO DATE CULTURE WILL BE HELD FOR 5 DAYS BEFORE ISSUING A FINAL NEGATIVE REPORT   Report Status 04/24/2012 FINAL   Final   CULTURE, BLOOD (ROUTINE X 2)     Status: Normal   Collection Time   04/18/12  3:01 PM      Component Value Range Status Comment   Specimen Description BLOOD RIGHT HAND   Final     Special Requests BOTTLES DRAWN AEROBIC ONLY [facility]   Final    Culture  Setup Time 04/18/2012 21:59   Final    Culture     Final    Value:        BLOOD CULTURE RECEIVED NO GROWTH TO DATE CULTURE WILL BE HELD FOR 5 DAYS BEFORE ISSUING A FINAL NEGATIVE REPORT   Report Status 04/24/2012 FINAL   Final   URINE CULTURE     Status: Normal   Collection Time   04/23/12  5:43 PM      Component Value Range Status Comment   Specimen Description URINE, CLEAN CATCH   Final    Special Requests NONE   Final    Culture  Setup Time 04/24/2012 03:06   Final    Colony Count NO GROWTH  Final    Culture NO GROWTH   Final    Report Status 04/25/2012 FINAL   Final      Studies: Dg Chest 2 View  04/25/2012  *RADIOLOGY REPORT*  Clinical Data: Pneumonia, follow-up  CHEST - 2 VIEW  Comparison: Portable chest x-ray of 04/23/2012  Findings: There has been some improvement in airspace disease with residual airspace disease in the upper lobes right greater than left, most consistent with pneumonia.  There is cardiomegaly present with small bilateral pleural effusions.  A permanent pacemaker remains.  The bones are osteopenic.  IMPRESSION:  1.  Improvement in airspace disease with visual opacities in both upper lobes. 2.  Cardiomegaly with bilateral pleural effusions.   Original Report Authenticated By: Dwyane Dee, M.D.     Scheduled Meds:   . antiseptic oral rinse  15 mL Mouth Rinse q12n4p  . aspirin EC  81 mg Oral Daily  . chlorhexidine  15 mL Mouth Rinse BID  . docusate sodium  100 mg Oral BID  . escitalopram  10 mg Oral QPM  . feeding supplement  237 mL Oral Q1500  . feeding supplement  1 Container Oral TID BM  . levothyroxine  100 mcg Oral QAC breakfast  . lisinopril  2.5 mg Oral Daily  . rivaroxaban  10 mg Oral Daily  . sodium chloride  3 mL Intravenous Q12H  . [DISCONTINUED] furosemide  40 mg Intravenous Daily  . [DISCONTINUED] potassium chloride  60 mEq Oral BID   Continuous Infusions:   Principal  Problem:  *Displaced intertrochanteric fracture of left femur Active Problems:  PAROXYSMAL ATRIAL FIBRILLATION  Chronic diastolic heart failure  Falls  NSTEMI (non-ST elevated myocardial infarction)  Leukocytosis  Anemia  Acute respiratory failure with hypoxia     Brendia Sacks, MD  [facility] Team 6 Pager [phone] If 8PM-8AM, please contact night-coverage at [email or web address], password [facility] 04/26/2012, 12:22 PM  LOS: 12 days

## 2012-04-26 NOTE — Progress Notes (Addendum)
Subjective: 9 Days Post-Op Procedure(s) (LRB): INTRAMEDULLARY (IM) NAIL FEMORAL (Left) Up in wheelchair awake, alert Oriented x 2. Patient reports pain as mild.    Objective:   VITALS:  Temp:  [97.8 F (36.6 C)-98.5 F (36.9 C)] 98.5 F (36.9 C) (12/12 0641) Pulse Rate:  [68-77] 77  (12/12 0641) Resp:  [14-16] 14  (12/12 0641) BP: (90-134)/(59-70) 134/59 mmHg (12/12 0641) SpO2:  [97 %-99 %] 98 % (12/12 0641)  Neurologically intact ABD soft Intact pulses distally Dorsiflexion/Plantar flexion intact No cellulitis present   LABS  Basename 04/26/12 0410 04/25/12 0425 04/24/12 0430  HGB 11.2* 11.5* 12.2  WBC 15.7* 17.3* --  PLT 295 288 --    Basename 04/26/12 0410 04/25/12 0425  NA 135 135  K 5.2* 4.1  CL 97 96  CO2 32 32  BUN 10 9  CREATININE 0.85 0.79  GLUCOSE 99 99   No results found for this basename: LABPT:2,INR:2 in the last 72 hours   Assessment/Plan: 9 Days Post-Op Procedure(s) (LRB): INTRAMEDULLARY (IM) NAIL FEMORAL (Left)  Up with therapy Discharge home with home health Home with 24/7 care. Advised family to keep dry dressing  On incisions See her back in 1 week not 2 originally planned. Aspirin for anti DVT prophylaxis. Weight bearing as tolerated bed to chair and bed to wheel chair. Amber Love E 04/26/2012, 1:16 PM

## 2012-04-26 NOTE — Progress Notes (Signed)
CARE MANAGEMENT NOTE 04/26/2012  Patient:  Amber Love, Amber Love   Account Number:  [id number]  Date Initiated:  04/16/2012  Documentation initiated by:  DAVIS,RHONDA  Subjective/Objective Assessment:   pt with fall and left hip fracture, also sustained nstemi     Action/Plan:   home   Anticipated DC Date:  04/19/2012   Anticipated DC Plan:  SKILLED NURSING FACILITY  In-house referral  Clinical Social Worker      DC Associate Professor  CM consult      [facility] Choice  HOME HEALTH   Choice offered to / List presented to:  C-4 Adult Children   DME arranged  NA      DME agency  NA     HH arranged  HH-1 RN  HH-2 PT  HH-3 OT  HH-9 CAPS PROGRAM      HH agency  [facility]   Status of service:  Completed, signed off Medicare Important Message given?  NA - LOS <3 / Initial given by admissions (If response is "NO", the following Medicare IM given date fields will be blank) Date Medicare IM given:   Date Additional Medicare IM given:    Discharge Disposition:    Per UR Regulation:  Reviewed for med. necessity/level of care/duration of stay  If discussed at Long Length of Stay Meetings, dates discussed:   04/24/2012    Comments:  04/26/2012 Damaris Schooner RN CCM [phone] DISCHARGE PLANS have changed from SNF rehab to home with [facility] services.CM spoke with patient and grand daughter who advised that patient is returning to her home in [place] [facility]) where they will care for patient. One grand daughter lives with patient and is primary caregiver. Other grand daughters will also care for patient. Pt has CAP program in place and has nurse assistant for designated hours. Pt was already active with this program. [facility] notified and can provide [facility], PT, and OT services. Pt does have lift chair, RW, BSC, hospital bed, and wheelchair at home. Spoke with Katrina Ketring -[facility] on aging who states they are ready to resume  personal care services with patient. Family has been in contact with them.

## 2012-05-15 LAB — CULTURE, BLOOD (ROUTINE X 2): Culture: NO GROWTH

## 2012-05-21 ENCOUNTER — Encounter: Payer: Self-pay | Admitting: Internal Medicine

## 2012-05-21 DIAGNOSIS — I495 Sick sinus syndrome: Secondary | ICD-10-CM

## 2012-12-11 ENCOUNTER — Encounter: Payer: Self-pay | Admitting: *Deleted

## 2012-12-31 ENCOUNTER — Encounter: Payer: Self-pay | Admitting: Internal Medicine

## 2012-12-31 DIAGNOSIS — I495 Sick sinus syndrome: Secondary | ICD-10-CM

## 2013-03-05 ENCOUNTER — Telehealth: Payer: Self-pay | Admitting: Internal Medicine

## 2013-03-05 NOTE — Telephone Encounter (Signed)
03-05-13 per pt's dtr, pt bed ridden at this time, has had hip replacement due to hip fracture back in December, cannot do checks, dtr will decide if pt ever able to come in, only ambulatory at this time, will call/mt

## 2013-03-05 NOTE — Telephone Encounter (Signed)
Patient unable to come in for office visits.  We will follow her PPM by [employer] TTM's only.

## 2013-06-02 IMAGING — CR DG PORTABLE PELVIS
1 series · 1 of 1 positions shown · non-contrast
Comparison: Intraoperative films performed earlier today at [DATE]
p.m., and left hip radiographs performed 04/14/2012

CLINICAL DATA: Status post internal fixation of left hip fracture.

PORTABLE PELVIS

[AP]
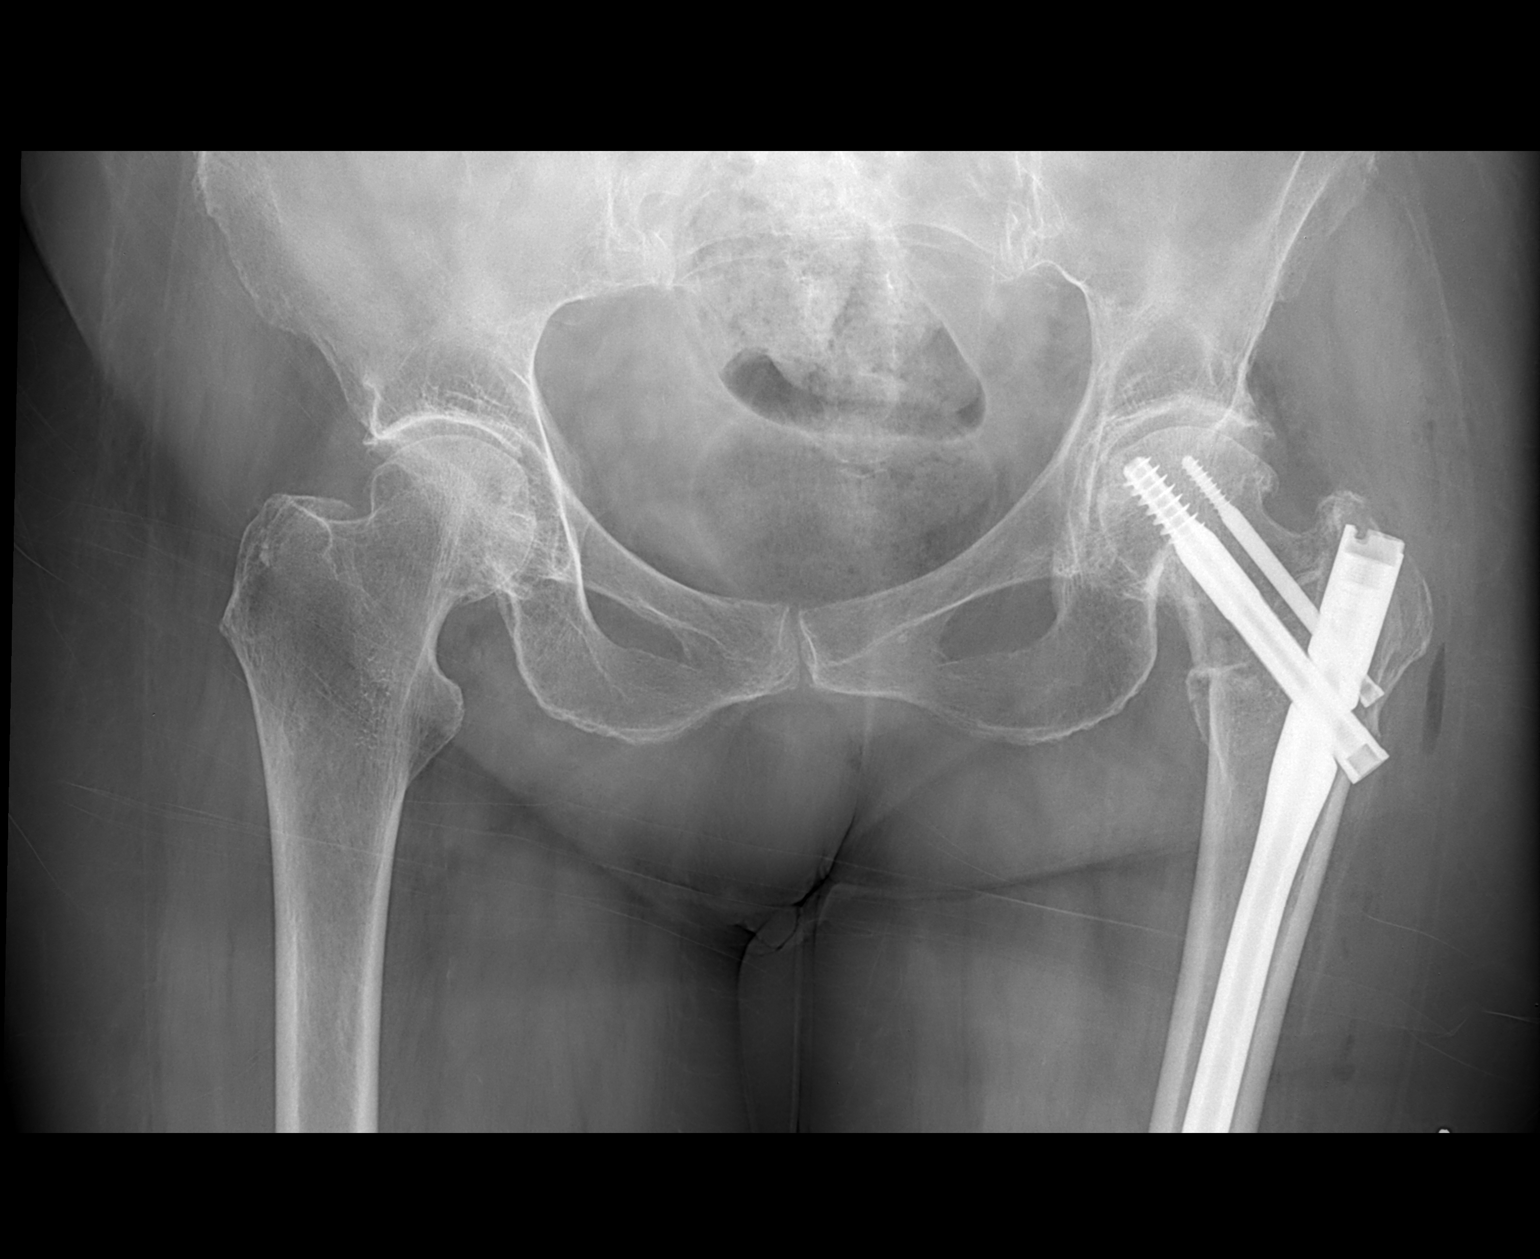

[1 of 1 positions shown; findings below may reference images not displayed]

FINDINGS: There has been placement of an intramedullary rod within
the proximal left femur, with associated screws through the
patient's left femoral intertrochanteric fracture, transfixing the
fracture in grossly anatomic alignment.  The left femoral head
remains seated at the acetabulum.

The right hip joint is grossly unremarkable in appearance.  Mild
degenerative change is noted at the lower lumbar spine.  The
sacroiliac joints are within normal limits.

Scattered soft tissue air is noted overlying the surgical site.
The visualized bowel gas pattern is grossly unremarkable.
IMPRESSION: Status post placement of intramedullary rod within the proximal
left femur, with screws transfixing the left femoral
intertrochanteric fracture in grossly anatomic alignment.

## 2013-06-04 ENCOUNTER — Telehealth: Payer: Self-pay

## 2013-06-04 NOTE — Telephone Encounter (Signed)
Patient past away per Obituary in [employer] °

## 2013-06-07 IMAGING — US US ABDOMEN COMPLETE
1 series · 13 of 25 positions shown · non-contrast
Comparison: None.

CLINICAL DATA: 89-year-old female with elevated LFTs and
bilirubin.  History of bladder cancer.

ABDOMINAL ULTRASOUND COMPLETE

[Series 1: us abdomen complete · 0.28mm/px · 13 of 64 slices shown]
[im 1/64]
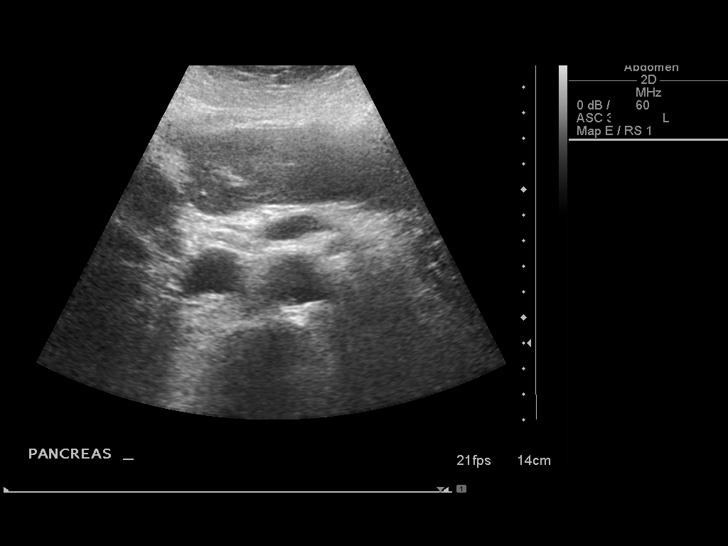
[im 6/64]
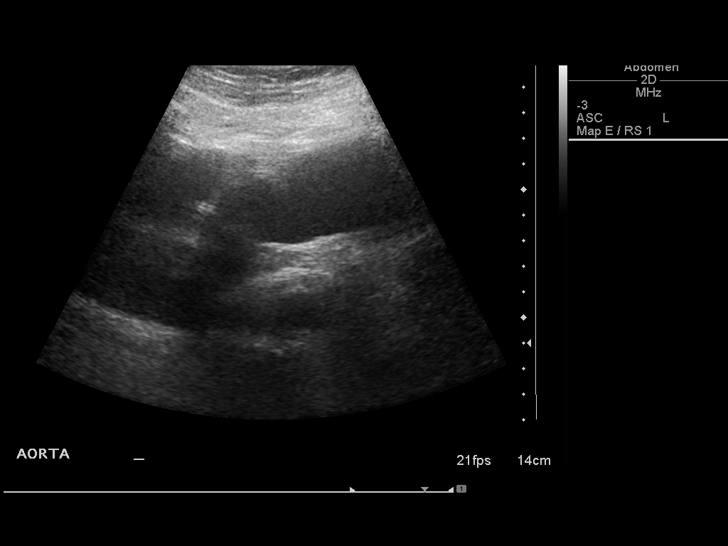
[im 11/64]
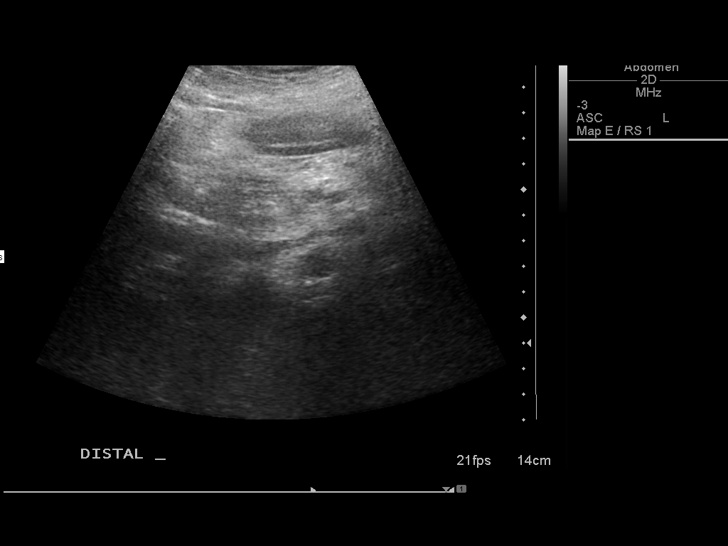
[im 16/64]
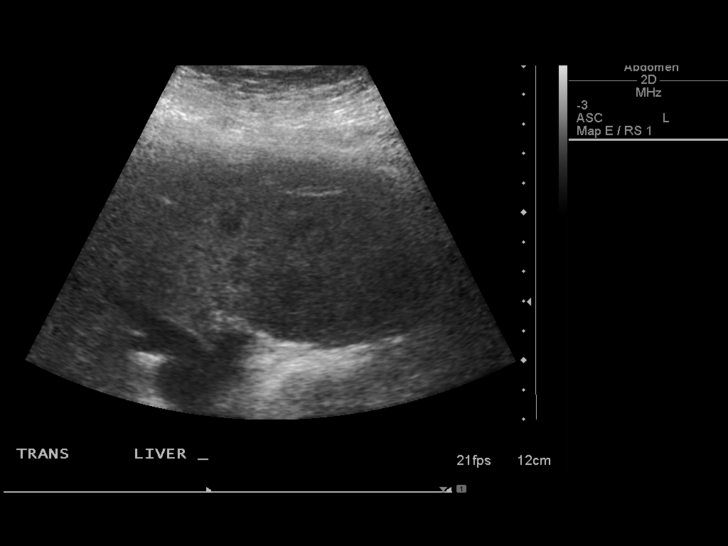
[im 22/64]
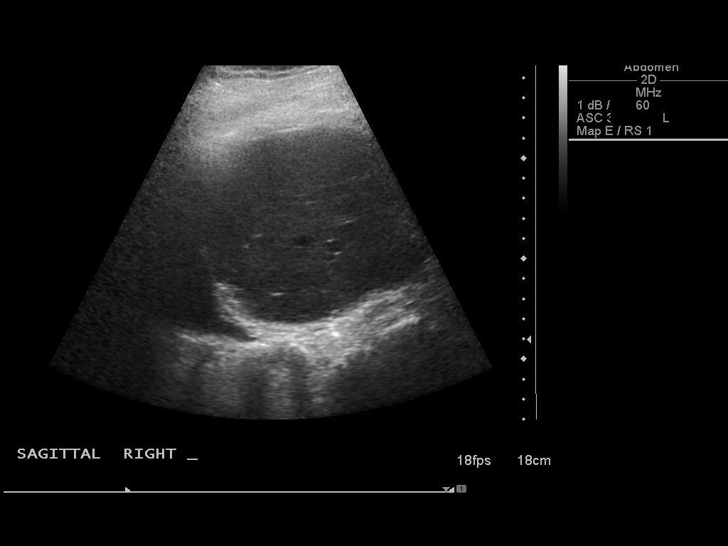
[im 27/64]
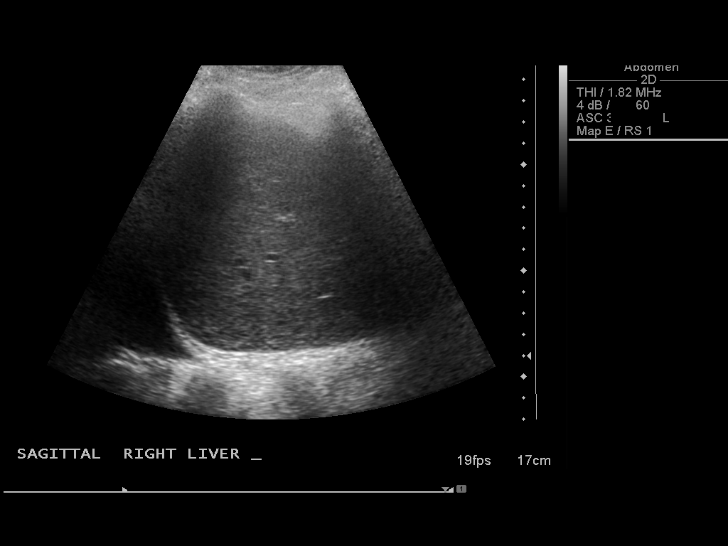
[im 32/64]
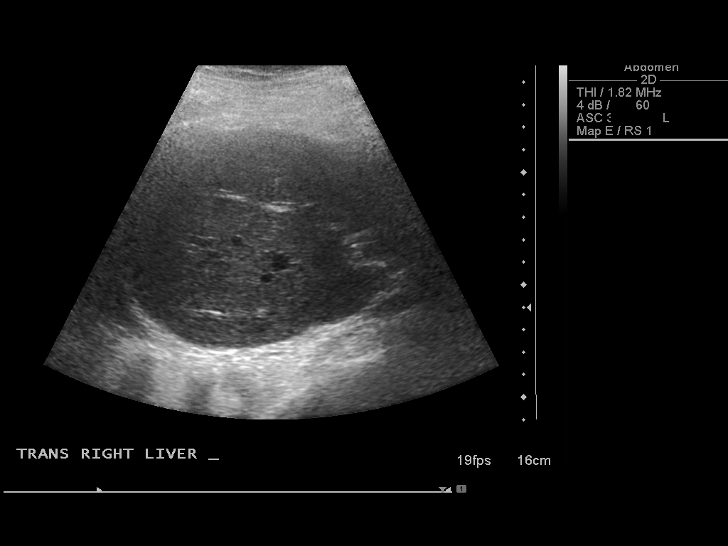
[im 37/64]
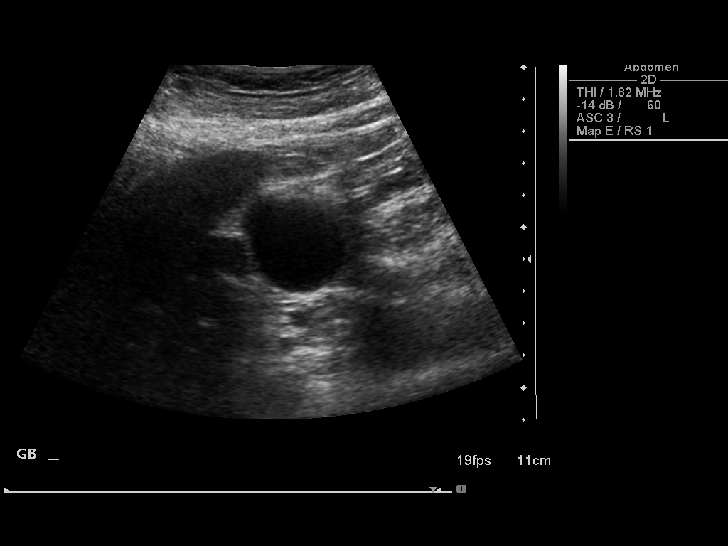
[im 43/64]
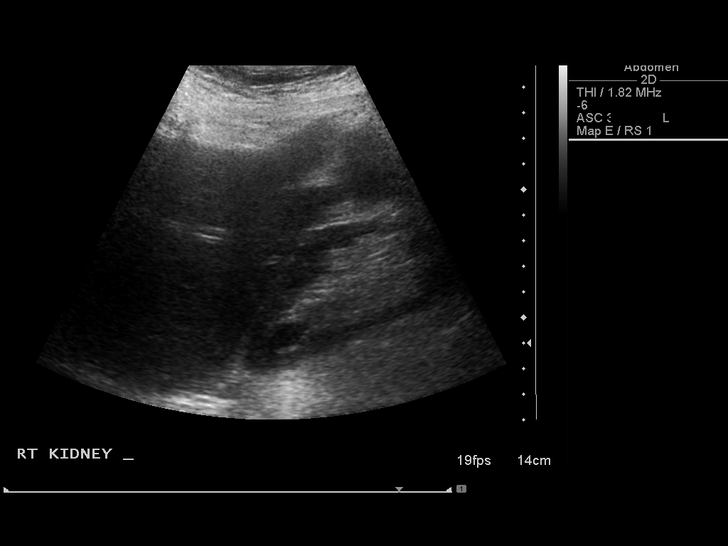
[im 48/64]
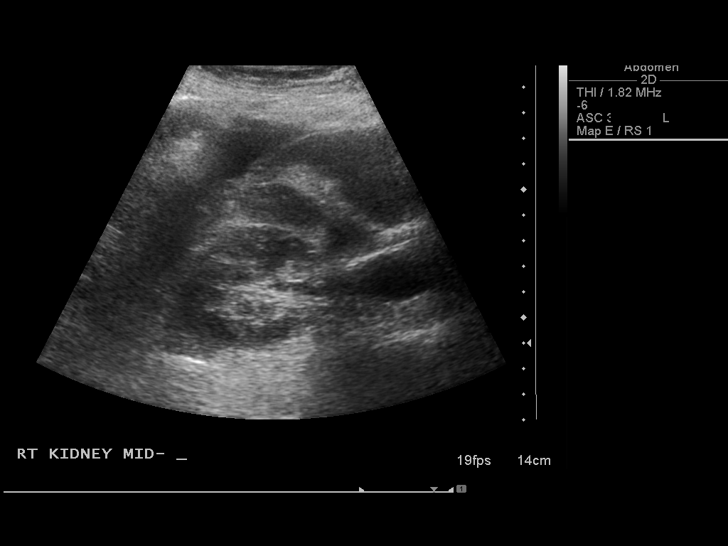
[im 53/64]
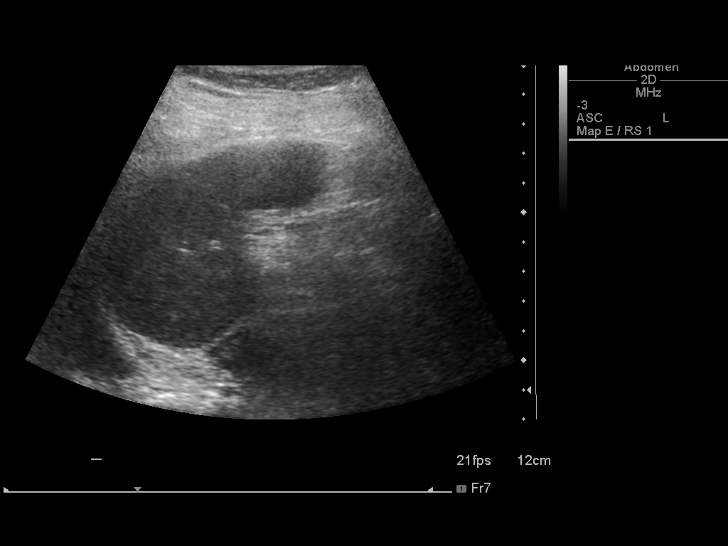
[im 58/64]
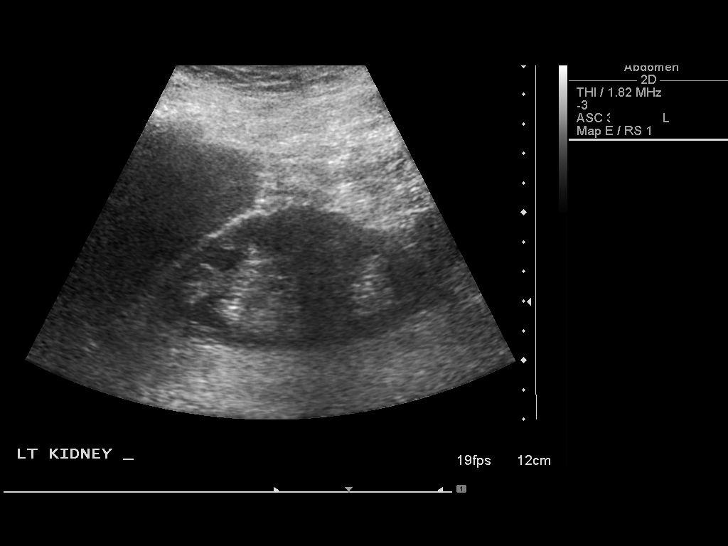
[im 64/64]
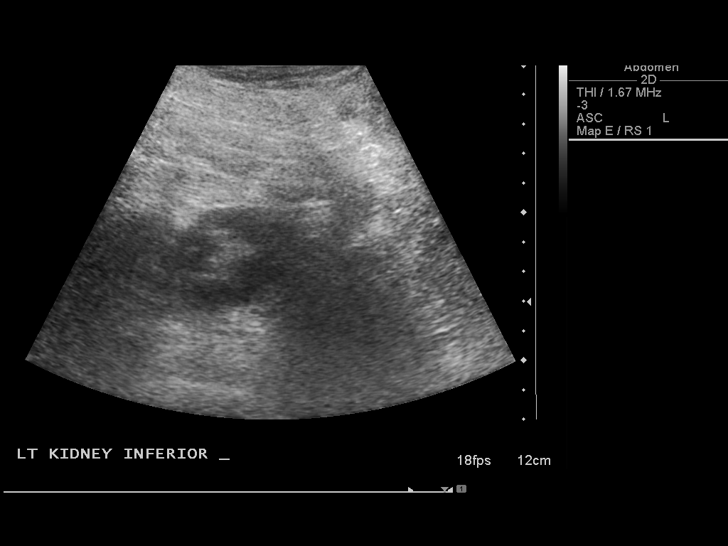

[13 of 25 positions shown; findings below may reference images not displayed]

FINDINGS: Gallbladder:  The gallbladder is unremarkable. There is no evidence
of gallstones, gallbladder wall thickening, or pericholecystic
fluid.

Common Bile Duct:  There is no evidence of intrahepatic or
extrahepatic biliary dilation. The CBD measures 3.9 mm in greatest
diameter.

Liver:  The liver is within normal limits in parenchymal
echogenicity. No focal abnormalities are identified.

IVC:  Appears normal.

Pancreas:  Although the pancreas is difficult to visualize in its
entirety, no focal pancreatic abnormality is identified.

Spleen:  Within normal limits in size and echotexture.

Right kidney:  The right kidney is normal in size and parenchymal
echogenicity. Cortical thinning is noted.  There is no evidence of
solid mass, hydronephrosis or definite renal calculi.  The right
kidney measures 10.7 cm.

Left kidney:  The left kidney is normal in size and parenchymal
echogenicity. Cortical thinning is noted.  There is no evidence of
solid mass, hydronephrosis or definite renal calculi.   The left
kidney measures 10.2 cm.

Abdominal Aorta:  No abdominal aortic aneurysm identified.
Atherosclerotic plaque is present.

There is no evidence of ascites.
IMPRESSION: No evidence of acute abnormality.

Unremarkable liver sonographically.  No evidence of biliary
dilatation.

Bilateral renal cortical thinning / atrophy.

## 2013-06-16 DEATH — deceased
